# Patient Record
Sex: Female | Born: 1975 | Race: Black or African American | Hispanic: No | Marital: Married
Health system: Southern US, Community
[De-identification: ages and names within clinical notes are randomized; demographics above are authoritative.]

## PROBLEM LIST (undated history)

## (undated) DIAGNOSIS — IMO0002 Reserved for concepts with insufficient information to code with codable children: Secondary | ICD-10-CM

## (undated) DIAGNOSIS — R51 Headache: Secondary | ICD-10-CM

## (undated) HISTORY — PX: ENDOMETRIAL ABLATION: SHX621

## (undated) HISTORY — PX: WISDOM TOOTH EXTRACTION: SHX21

---

## 2000-09-29 ENCOUNTER — Other Ambulatory Visit: Admission: RE | Admit: 2000-09-29 | Discharge: 2000-09-29 | Payer: Self-pay | Admitting: Obstetrics and Gynecology

## 2002-01-19 ENCOUNTER — Other Ambulatory Visit: Admission: RE | Admit: 2002-01-19 | Discharge: 2002-01-19 | Payer: Self-pay | Admitting: Obstetrics and Gynecology

## 2002-03-21 ENCOUNTER — Ambulatory Visit (HOSPITAL_COMMUNITY): Admission: RE | Admit: 2002-03-21 | Discharge: 2002-03-21 | Payer: Self-pay | Admitting: Obstetrics and Gynecology

## 2002-03-21 ENCOUNTER — Encounter: Payer: Self-pay | Admitting: Obstetrics and Gynecology

## 2002-07-18 ENCOUNTER — Inpatient Hospital Stay (HOSPITAL_COMMUNITY): Admission: AD | Admit: 2002-07-18 | Discharge: 2002-07-20 | Payer: Self-pay | Admitting: Obstetrics and Gynecology

## 2003-10-17 ENCOUNTER — Other Ambulatory Visit: Admission: RE | Admit: 2003-10-17 | Discharge: 2003-10-17 | Payer: Self-pay | Admitting: Obstetrics and Gynecology

## 2003-11-12 ENCOUNTER — Ambulatory Visit (HOSPITAL_COMMUNITY): Admission: RE | Admit: 2003-11-12 | Discharge: 2003-11-12 | Payer: Self-pay | Admitting: Obstetrics and Gynecology

## 2004-04-05 ENCOUNTER — Inpatient Hospital Stay (HOSPITAL_COMMUNITY): Admission: AD | Admit: 2004-04-05 | Discharge: 2004-04-07 | Payer: Self-pay | Admitting: Obstetrics and Gynecology

## 2004-11-27 ENCOUNTER — Other Ambulatory Visit: Admission: RE | Admit: 2004-11-27 | Discharge: 2004-11-27 | Payer: Self-pay | Admitting: Obstetrics and Gynecology

## 2006-10-01 ENCOUNTER — Other Ambulatory Visit: Admission: RE | Admit: 2006-10-01 | Discharge: 2006-10-01 | Payer: Self-pay | Admitting: Obstetrics and Gynecology

## 2008-03-27 ENCOUNTER — Other Ambulatory Visit: Admission: RE | Admit: 2008-03-27 | Discharge: 2008-03-27 | Payer: Self-pay | Admitting: Family Medicine

## 2008-04-10 ENCOUNTER — Encounter: Admission: RE | Admit: 2008-04-10 | Discharge: 2008-04-10 | Payer: Self-pay | Admitting: Family Medicine

## 2009-02-28 ENCOUNTER — Ambulatory Visit (HOSPITAL_COMMUNITY): Admission: RE | Admit: 2009-02-28 | Discharge: 2009-02-28 | Payer: Self-pay | Admitting: Obstetrics and Gynecology

## 2009-02-28 ENCOUNTER — Encounter (INDEPENDENT_AMBULATORY_CARE_PROVIDER_SITE_OTHER): Payer: Self-pay | Admitting: Obstetrics and Gynecology

## 2011-02-02 ENCOUNTER — Other Ambulatory Visit (HOSPITAL_COMMUNITY)
Admission: RE | Admit: 2011-02-02 | Discharge: 2011-02-02 | Disposition: A | Discharge status: Home / Self Care | Payer: 59 | Source: Ambulatory Visit | Attending: Family Medicine | Admitting: Family Medicine

## 2011-02-02 ENCOUNTER — Other Ambulatory Visit: Payer: Self-pay | Admitting: Family Medicine

## 2011-02-02 DIAGNOSIS — Z124 Encounter for screening for malignant neoplasm of cervix: Secondary | ICD-10-CM | POA: Insufficient documentation

## 2011-02-03 ENCOUNTER — Ambulatory Visit
Admission: RE | Admit: 2011-02-03 | Discharge: 2011-02-03 | Disposition: A | Discharge status: Home / Self Care | Payer: 59 | Source: Ambulatory Visit | Attending: Family Medicine | Admitting: Family Medicine

## 2011-02-03 MED ORDER — GADOBENATE DIMEGLUMINE 529 MG/ML IV SOLN
20.0000 mL | Freq: Once | INTRAVENOUS | Status: AC | PRN
  Administered 2011-02-03: 20 mL via INTRAVENOUS

## 2011-02-25 LAB — CBC
HCT: 36.3 % (ref 36.0–46.0)
MCHC: 33.6 g/dL (ref 30.0–36.0)
MCV: 77 fL — ABNORMAL LOW (ref 78.0–100.0)
RBC: 4.71 MIL/uL (ref 3.87–5.11)
WBC: 3.2 10*3/uL — ABNORMAL LOW (ref 4.0–10.5)

## 2011-03-31 NOTE — Op Note (Signed)
NAME:  Toni Morales, Toni Morales NO.:  [id number]   MEDICAL RECORD NO.:  [id number]          PATIENT TYPE:  AMB   LOCATION:  SDC                           [facility]ILITY:  [facility]   PHYSICIAN:  Osborn Coho, M.D.   DATE OF BIRTH:  1976/07/21   DATE OF PROCEDURE:  02/28/2009  DATE OF DISCHARGE:                               OPERATIVE REPORT   PREOPERATIVE DIAGNOSES:  1. Menometrorrhagia.  2. Endometrial polyp.   POSTOPERATIVE DIAGNOSES:  1. Menometrorrhagia.  2. Endometrial polyp.   PROCEDURES:  1. Hysteroscopy.  2. Dilation and curettage.  3. Ablation via NovaSure.   ATTENDING:  Osborn Coho, MD   ANESTHESIA:  General.   SPECIMENS TO PATHOLOGY:  Endometrial curettings.   FLUIDS:  700 mL   URINE OUTPUT:  Quantity sufficient via straight cath prior to procedure.   HYSTEROSCOPIC FLUID DEFICIT:  LR 10 mL.   ESTIMATED BLOOD LOSS:  Minimal.   COMPLICATIONS:  None.   FINDINGS:  Uterus sounded 9.5 cm, cervical length measured 3.5 cm,  cavity length measured 6 cm, cavity width measured 4.7 cm, and the  ablation was performed at a power of 155 watts for a total of 1 and 10  seconds.   PROCEDURE IN DETAIL:  The patient was taken to the operating room after  risks, benefits, alternatives were discussed with the patient.  The  patient verbalized understanding, and consent signed and witnessed.  The  patient was placed under general anesthesia and prepped and draped in  normal sterile fashion in the dorsal lithotomy position.  A bivalve  speculum was placed in the patient's vagina and the anterior lip of the  cervix grasped with a single-tooth tenaculum.  A paracervical block was  administered using a total of 10 mL of 1% lidocaine and the uterus  sounded to 9.5 cm.  The hysteroscope was introduced and polypoid lesions  noted on the right lateral uterine wall.  Curettage was performed until  a gritty texture was noted.  The hysteroscope was reintroduced and no  obvious  intracavitary lesions were noted.  The bivalve NovaSure  instrument was then introduced into the uterine cavity and ablation was  performed.  At a power of 155 watts for a total of 1 and 10 seconds.  The ablation instrument remove was removed and the hysteroscope  reintroduced and good ablation results were noted.  All instruments were  removed.  There was small amount of bleeding at the left tenaculum site  and pressure was held and hemostasis was obtained.  Count was correct.  The patient tolerated the procedure well and was currently being  transferred to the recovery room in good condition.       Osborn Coho, M.D.  Electronically Signed     AR/MEDQ  D:  02/28/2009  T:  03/01/2009  Job:  [id number]

## 2011-04-03 NOTE — H&P (Signed)
NAME:  Toni Morales, Toni Morales NO.:  [id number]   MEDICAL RECORD NO.:  [id number]                   PATIENT TYPE:  INP   LOCATION:  9162                                 [facility]ILITY:  [facility]   PHYSICIAN:  Hal Morales, M.D.             DATE OF BIRTH:  [birth year]   DATE OF ADMISSION:  04/05/2004  DATE OF DISCHARGE:                                HISTORY & PHYSICAL   HISTORY OF PRESENT ILLNESS:  Toni Morales is a 35 year old married black  female, gravida 3, para 2-0-0-2, at 38-5/7 weeks, who presents complaining  of uterine contractions every 6-7 minutes now and of increasing intensity.  She does report that her uterine contractions started around 8 a.m. on the  20th.  She denies any leaking but has seen a small amount of bloody show.  She denies any nausea, vomiting, headaches or visual disturbances.  She  reports positive fetal movement.  Her pregnancy has been followed at [facility] by the certified nurse midwife service and has been  essentially uncomplicated but at risk for:  #1 - Closely spaced pregnancies,  #2 - obesity.  Her group B strep is negative.  She would like an epidural  for this labor.   OBSTETRICAL/GYNECOLOGICAL HISTORY:  She is a gravida 3, para 2-0-0-2, who  delivered a viable female infant in August of 1998 who weighed 7 pounds 6  ounces at 40+ weeks' gestation following a 17-hour labor; his name is  Economist.  In September of 2003, she delivered a viable female infant who  weighed 6 pounds 9 ounces at 35 weeks' gestation following an 11-hour labor;  her name is Anaya, delivered by [employer] Williams, C.N.M.  Her LMP for this  pregnancy was July 07, 2003, giving her an [facility] of Apr 13, 2004, confirmed  by early ultrasound.   ALLERGIES:  She has no known drug allergies.   GENERAL MEDICAL HISTORY:  She reports having had the usual childhood  diseases.  She has no medical problems, though has had an occasional urinary  tract  infection.  Her only surgery was oral surgery at age 54 and her only  hospitalization was for childbirth.   FAMILY HISTORY:  Her family history is significant for maternal grandmother,  paternal grandmother and paternal aunt with chronic hypertension and  maternal grandmother with throat cancer.   GENETIC HISTORY:  Genetic history is negative.   SOCIAL HISTORY:  She is married to [facility], who is involved and  supportive.  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  They are of the [place] faith.  They deny  any illicit drug use, alcohol or smoking with this pregnancy.   PRENATAL LABORATORY DATA:  Her blood type is O-positive; her antibody screen  is negative.  Syphilis is nonreactive.  Rubella is immune.  Hepatitis B  surface antigen is negative.  HIV  is nonreactive.  Her GC, Chlamydia and  group B strep at 36 weeks were all negative.   PHYSICAL EXAM:  VITAL SIGNS:  Her vital signs are stable, she is afebrile.  HEENT:  Her HEENT is grossly within normal limits.  HEART:  Her heart is regular rate and rhythm.  CHEST:  Her chest is clear.  BREASTS:  Breasts are soft and nontender.  ABDOMEN:  Her abdomen is gravity with uterine contractions every 5-6  minutes.  Her fetal heart rate is reactive and reassuring.  PELVIC:  Her pelvic exam is 6-7 cm, 90%, vertex -1.  EXTREMITIES:  Her extremities are within normal limits.   ASSESSMENT:  1. Intrauterine pregnancy at term.  2. Active labor.  3. Negative group B streptococcus.  4. Desires epidural for labor.   PLAN:  Her plan is to admit to labor and delivery, to follow routine C.N.M.  orders, to give her an epidural for labor and to notify Dr. Hal Morales of admission.     Concha Pyo. Duplantis, C.N.M.              Hal Morales, M.D.    SJD/MEDQ  D:  04/05/2004  T:  04/05/2004  Job:  [id number]

## 2011-04-03 NOTE — H&P (Signed)
NAME:  Toni Morales, Toni Morales NO.:  [id number]   MEDICAL RECORD NO.:  [id number]                   PATIENT TYPE:  INP   LOCATION:  9168                                 [facility]ACILITY:  [facility]   PHYSICIAN:  Crist Fat. Rivard, M.D.              DATE OF BIRTH:  1975/12/19   DATE OF ADMISSION:  07/18/2002  DATE OF DISCHARGE:                                HISTORY & PHYSICAL   HISTORY OF PRESENT ILLNESS:  The patient is a 35 year old married black  female gravida 2, para 1-0-0-1, at 38-1/7 weeks by LMP and confirmed by  ultrasound who presents complaining of uterine contractions every 3-5  minutes since about 3 o'clock this morning.  She denies any nausea,  vomiting, headaches, or visual disturbances.  She denies any leaking or  vaginal bleeding.  She reports positive fetal movement.  Her pregnancy has  been followed at [facility] by the certified nurse midwife  service and has been essentially uncomplicated though at risk for (1)  obesity, (2) second-trimester bleeding.  She is group B strep negative.  She  plans on an epidural for labor but currently is desiring to ambulate.   OBSTETRICS AND GYNECOLOGIC HISTORY:  She is a gravida 2, para 1-0-0-1, who  delivered a viable female infant in August 1998 who weighed 7 pounds 6 ounces  at 40 weeks after a 17-hour labor.  She delivered vaginally with no  complications and did have an epidural with that labor.  She reports no  other GYN issues.   GENERAL MEDICAL HISTORY:  She has no known drug allergies.  She reports  having had the usual childhood diseases.  She reports occasional urinary  tract infection and a history of oral surgery when she was 35 years old.   FAMILY HISTORY:  Her family history is significant for maternal grandmother  and paternal grandmother with chronic hypertension and maternal grandmother  with throat cancer.   GENETIC HISTORY:  Genetic history is significant only for some distant  relative with sickle cell trait.   SOCIAL HISTORY:  She is married to [place] who is involved and supportive.  They are of the [place] faith.  Both of them are employed full time; she  is in Audiological scientist, he is in Journalist, newspaper.  They deny any illicit drug  use, alcohol or smoking with this pregnancy.   PRENATAL LABORATORIES:  Her blood type is O positive, her antibody screen is  negative.  Sickle cell trait is negative.  Syphilis is nonreactive.  Rubella  is immune.  Hepatitis B surface antigen is negative.  HIV is nonreactive.  GC and Chlamydia are both negative.  Pap smear is within normal limits.  Her  1-hour glucola was 93 and maternal serum alpha-fetoprotein was within normal  range and her 36-week beta strep was negative.   PHYSICAL EXAMINATION:  VITAL SIGNS: Her vital signs are stable.  She is  afebrile.  HEENT: Grossly within normal limits.  HEART: Regular rhythm and rate.  CHEST: Clear.  BREASTS: Soft and nontender.  ABDOMEN: Gravid with uterine contractions every 3-6 minutes.  Fetal heart  rate is reactive and reassuring.  Cervix is 5 cm, 90%, vertex -1 with  bulging membranes.  EXTREMITIES: Within normal limits.   ASSESSMENT:  1. Intrauterine pregnancy at term.  2. Active labor.  3. Group B streptococcal negative.  4. Desires epidural for labor later on.   PLAN:  Her plan is to admit to labor and delivery, to follow routine CNM  orders and to notify Dr. Estanislado Pandy of the patient's admission.     Concha Pyo. Duplantis, C.N.M.              Crist Fat Rivard, M.D.    SJD/MEDQ  D:  07/18/2002  T:  07/18/2002  Job:  [id number]

## 2012-03-22 ENCOUNTER — Other Ambulatory Visit: Payer: Self-pay | Admitting: Neurosurgery

## 2012-05-10 NOTE — Pre-Procedure Instructions (Signed)
20 Toni Morales  05/10/2012   Your procedure is scheduled on:  July 10th  Report to [facility] at 0600 AM.  Call this number if you have problems the morning of surgery: [phone]   Remember:   Do not eat food or drink:After Midnight.  Take these medicines the morning of surgery with A SIP OF WATER: none   Do not wear jewelry, make-up or nail polish.  Do not wear lotions, powders, or perfumes. You may wear deodorant.  Do not shave 48 hours prior to surgery. Men may shave face and neck.  Do not bring valuables to the hospital.  Contacts, dentures or bridgework may not be worn into surgery.  Leave suitcase in the car. After surgery it may be brought to your room.  For patients admitted to the hospital, checkout time is 11:00 AM the day of discharge.   Patients discharged the day of surgery will not be allowed to drive home.  Special Instructions: CHG Shower Use Special Wash: 1/2 bottle night before surgery and 1/2 bottle morning of surgery.   Please read over the following fact sheets that you were given: Pain Booklet, Coughing and Deep Breathing, MRSA Information and Surgical Site Infection Prevention

## 2012-05-11 ENCOUNTER — Inpatient Hospital Stay (HOSPITAL_COMMUNITY): Admission: RE | Admit: 2012-05-11 | Discharge: 2012-05-11 | Payer: 59 | Source: Ambulatory Visit

## 2012-05-11 ENCOUNTER — Encounter (HOSPITAL_COMMUNITY): Payer: Self-pay

## 2012-05-11 HISTORY — DX: Headache: R51

## 2012-05-11 NOTE — Progress Notes (Signed)
PT WOULD ALSO LIKE ADVANCE DIRECTIVE PACKET..(IN WITH CHART).  WE CONFIRMED HER LAB APPT  TIME AND DATE....WILL BE HERE 15 MIN EARLY TO REGISTER. DA

## 2012-05-12 ENCOUNTER — Other Ambulatory Visit (HOSPITAL_COMMUNITY): Payer: 59

## 2012-05-12 ENCOUNTER — Encounter (HOSPITAL_COMMUNITY)
Admission: RE | Admit: 2012-05-12 | Discharge: 2012-05-12 | Disposition: A | Discharge status: Home / Self Care | Payer: 59 | Source: Ambulatory Visit | Attending: Neurosurgery | Admitting: Neurosurgery

## 2012-05-12 ENCOUNTER — Encounter (HOSPITAL_COMMUNITY): Payer: Self-pay | Admitting: Pharmacy Technician

## 2012-05-12 LAB — ABO/RH: ABO/RH(D): O POS

## 2012-05-12 LAB — BASIC METABOLIC PANEL
BUN: 12 mg/dL (ref 6–23)
CO2: 25 mEq/L (ref 19–32)
Calcium: 8.8 mg/dL (ref 8.4–10.5)
Chloride: 105 mEq/L (ref 96–112)
Creatinine, Ser: 0.87 mg/dL (ref 0.50–1.10)

## 2012-05-12 LAB — CBC
HCT: 38.5 % (ref 36.0–46.0)
MCH: 27.1 pg (ref 26.0–34.0)
MCHC: 33.2 g/dL (ref 30.0–36.0)
MCV: 81.4 fL (ref 78.0–100.0)
RDW: 14.7 % (ref 11.5–15.5)

## 2012-05-24 MED ORDER — CEFAZOLIN SODIUM-DEXTROSE 2-3 GM-% IV SOLR
2.0000 g | INTRAVENOUS | Status: AC
  Administered 2012-05-25: 2 g via INTRAVENOUS
  Filled 2012-05-24: qty 50

## 2012-05-25 ENCOUNTER — Encounter (HOSPITAL_COMMUNITY): Payer: Self-pay | Admitting: *Deleted

## 2012-05-25 ENCOUNTER — Inpatient Hospital Stay (HOSPITAL_COMMUNITY)
Admission: RE | Admit: 2012-05-25 | Discharge: 2012-05-29 | DRG: 026 | Disposition: A | Discharge status: Home / Self Care | Payer: 59 | Source: Ambulatory Visit | Attending: Neurosurgery | Admitting: Neurosurgery

## 2012-05-25 ENCOUNTER — Ambulatory Visit (HOSPITAL_COMMUNITY): Payer: 59 | Admitting: *Deleted

## 2012-05-25 ENCOUNTER — Encounter (HOSPITAL_COMMUNITY)
Admission: RE | Disposition: A | Discharge status: Home / Self Care | Payer: Self-pay | Source: Ambulatory Visit | Attending: Neurosurgery

## 2012-05-25 DIAGNOSIS — G935 Compression of brain: Principal | ICD-10-CM | POA: Diagnosis present

## 2012-05-25 DIAGNOSIS — B370 Candidal stomatitis: Secondary | ICD-10-CM | POA: Diagnosis not present

## 2012-05-25 DIAGNOSIS — F17200 Nicotine dependence, unspecified, uncomplicated: Secondary | ICD-10-CM | POA: Diagnosis present

## 2012-05-25 HISTORY — PX: SUBOCCIPITAL CRANIECTOMY CERVICAL LAMINECTOMY: SHX5404

## 2012-05-25 SURGERY — SUBOCCIPITAL CRANIECTOMY CERVICAL LAMINECTOMY/DURAPLASTY
Anesthesia: General | Site: Head | Wound class: Clean

## 2012-05-25 MED ORDER — NITROFURANTOIN MACROCRYSTAL 50 MG PO CAPS
50.0000 mg | ORAL_CAPSULE | Freq: Four times a day (QID) | ORAL | Status: DC
  Administered 2012-05-25 – 2012-05-29 (×16): 50 mg via ORAL
  Filled 2012-05-25 (×18): qty 1

## 2012-05-25 MED ORDER — HYDROMORPHONE HCL PF 1 MG/ML IJ SOLN
INTRAMUSCULAR | Status: AC
  Administered 2012-05-25: 14:00:00
  Filled 2012-05-25: qty 1

## 2012-05-25 MED ORDER — GLYCOPYRROLATE 0.2 MG/ML IJ SOLN
INTRAMUSCULAR | Status: DC | PRN
  Administered 2012-05-25: .7 mg via INTRAVENOUS

## 2012-05-25 MED ORDER — PROPOFOL 10 MG/ML IV EMUL
INTRAVENOUS | Status: DC | PRN
  Administered 2012-05-25: 200 mg via INTRAVENOUS
  Administered 2012-05-25: 100 mg via INTRAVENOUS

## 2012-05-25 MED ORDER — FENTANYL CITRATE 0.05 MG/ML IJ SOLN
INTRAMUSCULAR | Status: DC | PRN
  Administered 2012-05-25 (×2): 100 ug via INTRAVENOUS
  Administered 2012-05-25 (×4): 50 ug via INTRAVENOUS
  Administered 2012-05-25 (×2): 100 ug via INTRAVENOUS

## 2012-05-25 MED ORDER — DEXAMETHASONE SODIUM PHOSPHATE 4 MG/ML IJ SOLN
INTRAMUSCULAR | Status: DC | PRN
  Administered 2012-05-25: 10 mg via INTRAVENOUS

## 2012-05-25 MED ORDER — LACTATED RINGERS IV SOLN
INTRAVENOUS | Status: DC | PRN
  Administered 2012-05-25: 10:00:00 via INTRAVENOUS

## 2012-05-25 MED ORDER — POTASSIUM CHLORIDE IN NACL 20-0.9 MEQ/L-% IV SOLN
INTRAVENOUS | Status: DC
  Administered 2012-05-25 – 2012-05-26 (×2): via INTRAVENOUS
  Filled 2012-05-25 (×7): qty 1000

## 2012-05-25 MED ORDER — MORPHINE SULFATE 2 MG/ML IJ SOLN
INTRAMUSCULAR | Status: AC
  Administered 2012-05-25: 2 mg via INTRAVENOUS
  Filled 2012-05-25: qty 1

## 2012-05-25 MED ORDER — MIDAZOLAM HCL 5 MG/5ML IJ SOLN
INTRAMUSCULAR | Status: DC | PRN
  Administered 2012-05-25: 2 mg via INTRAVENOUS

## 2012-05-25 MED ORDER — ONDANSETRON HCL 4 MG PO TABS: 4.0000 mg | ORAL_TABLET | ORAL | Status: DC | PRN

## 2012-05-25 MED ORDER — DEXAMETHASONE SODIUM PHOSPHATE 4 MG/ML IJ SOLN
4.0000 mg | Freq: Three times a day (TID) | INTRAMUSCULAR | Status: DC
  Administered 2012-05-27 – 2012-05-28 (×2): 4 mg via INTRAVENOUS
  Filled 2012-05-25 (×5): qty 1

## 2012-05-25 MED ORDER — ONDANSETRON HCL 4 MG/2ML IJ SOLN: 4.0000 mg | Freq: Four times a day (QID) | INTRAMUSCULAR | Status: DC | PRN

## 2012-05-25 MED ORDER — SODIUM CHLORIDE 0.9 % IV SOLN
INTRAVENOUS | Status: DC | PRN
  Administered 2012-05-25: 08:00:00 via INTRAVENOUS

## 2012-05-25 MED ORDER — HYDROCODONE-ACETAMINOPHEN 5-325 MG PO TABS
1.0000 | ORAL_TABLET | ORAL | Status: DC | PRN
  Administered 2012-05-25 – 2012-05-26 (×6): 1 via ORAL
  Filled 2012-05-25 (×6): qty 1

## 2012-05-25 MED ORDER — DEXAMETHASONE SODIUM PHOSPHATE 4 MG/ML IJ SOLN
4.0000 mg | Freq: Four times a day (QID) | INTRAMUSCULAR | Status: AC
  Administered 2012-05-26 – 2012-05-27 (×4): 4 mg via INTRAVENOUS
  Filled 2012-05-25 (×4): qty 1

## 2012-05-25 MED ORDER — LIDOCAINE-EPINEPHRINE 0.5 %-1:200000 IJ SOLN
INTRAMUSCULAR | Status: DC | PRN
  Administered 2012-05-25: 50 mL

## 2012-05-25 MED ORDER — DEXAMETHASONE SODIUM PHOSPHATE 10 MG/ML IJ SOLN
6.0000 mg | Freq: Four times a day (QID) | INTRAMUSCULAR | Status: AC
  Administered 2012-05-25 – 2012-05-26 (×4): 6 mg via INTRAVENOUS
  Filled 2012-05-25 (×5): qty 0.6
  Filled 2012-05-25: qty 1

## 2012-05-25 MED ORDER — PANTOPRAZOLE SODIUM 40 MG IV SOLR
40.0000 mg | Freq: Every day | INTRAVENOUS | Status: DC
  Administered 2012-05-25 – 2012-05-26 (×2): 40 mg via INTRAVENOUS
  Filled 2012-05-25 (×3): qty 40

## 2012-05-25 MED ORDER — LABETALOL HCL 5 MG/ML IV SOLN: 10.0000 mg | INTRAVENOUS | Status: DC | PRN

## 2012-05-25 MED ORDER — LIDOCAINE HCL (CARDIAC) 20 MG/ML IV SOLN
INTRAVENOUS | Status: DC | PRN
  Administered 2012-05-25: 100 mg via INTRAVENOUS

## 2012-05-25 MED ORDER — HEMOSTATIC AGENTS (NO CHARGE) OPTIME
TOPICAL | Status: DC | PRN
  Administered 2012-05-25: 1 via TOPICAL

## 2012-05-25 MED ORDER — ONDANSETRON HCL 4 MG/2ML IJ SOLN
INTRAMUSCULAR | Status: DC | PRN
  Administered 2012-05-25: 4 mg via INTRAVENOUS

## 2012-05-25 MED ORDER — ROCURONIUM BROMIDE 100 MG/10ML IV SOLN
INTRAVENOUS | Status: DC | PRN
  Administered 2012-05-25 (×2): 5 mg via INTRAVENOUS
  Administered 2012-05-25: 7.5 mg via INTRAVENOUS
  Administered 2012-05-25 (×2): 5 mg via INTRAVENOUS
  Administered 2012-05-25: 10 mg via INTRAVENOUS
  Administered 2012-05-25: 50 mg via INTRAVENOUS

## 2012-05-25 MED ORDER — PROMETHAZINE HCL 25 MG PO TABS: 12.5000 mg | ORAL_TABLET | ORAL | Status: DC | PRN

## 2012-05-25 MED ORDER — PHENYLEPHRINE HCL 10 MG/ML IJ SOLN
10.0000 mg | INTRAVENOUS | Status: DC | PRN
  Administered 2012-05-25: 20 ug/min via INTRAVENOUS

## 2012-05-25 MED ORDER — THROMBIN 20000 UNITS EX KIT
PACK | CUTANEOUS | Status: DC | PRN
  Administered 2012-05-25: 20000 [IU] via TOPICAL

## 2012-05-25 MED ORDER — 0.9 % SODIUM CHLORIDE (POUR BTL) OPTIME
TOPICAL | Status: DC | PRN
  Administered 2012-05-25 (×5): 1000 mL

## 2012-05-25 MED ORDER — NEOSTIGMINE METHYLSULFATE 1 MG/ML IJ SOLN
INTRAMUSCULAR | Status: DC | PRN
  Administered 2012-05-25: 4 mg via INTRAVENOUS

## 2012-05-25 MED ORDER — MORPHINE SULFATE 2 MG/ML IJ SOLN
1.0000 mg | INTRAMUSCULAR | Status: DC | PRN
  Administered 2012-05-25 – 2012-05-27 (×12): 2 mg via INTRAVENOUS
  Filled 2012-05-25: qty 1
  Filled 2012-05-25: qty 2
  Filled 2012-05-25 (×10): qty 1

## 2012-05-25 MED ORDER — ONDANSETRON HCL 4 MG/2ML IJ SOLN
4.0000 mg | INTRAMUSCULAR | Status: DC | PRN
  Administered 2012-05-25: 4 mg via INTRAVENOUS
  Filled 2012-05-25: qty 2

## 2012-05-25 MED ORDER — HYDROMORPHONE HCL PF 1 MG/ML IJ SOLN
0.2500 mg | INTRAMUSCULAR | Status: DC | PRN
  Administered 2012-05-25: 0.5 mg via INTRAVENOUS

## 2012-05-25 SURGICAL SUPPLY — 84 items
APL SKNCLS STERI-STRIP NONHPOA (GAUZE/BANDAGES/DRESSINGS)
BENZOIN TINCTURE PRP APPL 2/3 (GAUZE/BANDAGES/DRESSINGS) IMPLANT
BLADE SURG 15 STRL LF DISP TIS (BLADE) IMPLANT
BLADE SURG 15 STRL SS (BLADE) ×2
BLADE SURG ROTATE 9660 (MISCELLANEOUS) ×4 IMPLANT
BLADE ULTRA TIP 2M (BLADE) IMPLANT
BRUSH SCRUB EZ 1% IODOPHOR (MISCELLANEOUS) ×2 IMPLANT
BUR ACORN 6.0 PRECISION (BURR) ×2 IMPLANT
CANISTER SUCTION 2500CC (MISCELLANEOUS) ×2 IMPLANT
CLIP TI MEDIUM 6 (CLIP) ×2 IMPLANT
CLOTH BEACON ORANGE TIMEOUT ST (SAFETY) ×2 IMPLANT
CONT SPEC 4OZ CLIKSEAL STRL BL (MISCELLANEOUS) ×2 IMPLANT
CORDS BIPOLAR (ELECTRODE) ×2 IMPLANT
COVER TABLE BACK 60X90 (DRAPES) IMPLANT
DECANTER SPIKE VIAL GLASS SM (MISCELLANEOUS) ×2 IMPLANT
DRAIN SNY WOU 7FLT (WOUND CARE) IMPLANT
DRAPE LAPAROTOMY 100X72 PEDS (DRAPES) ×2 IMPLANT
DRAPE MICROSCOPE LEICA (MISCELLANEOUS) IMPLANT
DRAPE WARM FLUID 44X44 (DRAPE) ×2 IMPLANT
DRESSING TELFA 8X3 (GAUZE/BANDAGES/DRESSINGS) ×2 IMPLANT
DURAGUARD 06CMX08CM ×1 IMPLANT
DURAPREP 6ML APPLICATOR 50/CS (WOUND CARE) ×2 IMPLANT
DURASEAL SPINE SEALANT 3ML (MISCELLANEOUS) ×1 IMPLANT
ELECT CAUTERY BLADE 6.4 (BLADE) ×2 IMPLANT
ELECT REM PT RETURN 9FT ADLT (ELECTROSURGICAL) ×2
ELECTRODE REM PT RTRN 9FT ADLT (ELECTROSURGICAL) ×1 IMPLANT
EVACUATOR 1/8 PVC DRAIN (DRAIN) IMPLANT
EVACUATOR SILICONE 100CC (DRAIN) IMPLANT
EXTENDED TIP APPLICATOR ×1 IMPLANT
GAUZE SPONGE 4X4 16PLY XRAY LF (GAUZE/BANDAGES/DRESSINGS) IMPLANT
GLOVE BIO SURGEON STRL SZ 6.5 (GLOVE) IMPLANT
GLOVE BIO SURGEON STRL SZ7 (GLOVE) IMPLANT
GLOVE BIO SURGEON STRL SZ7.5 (GLOVE) IMPLANT
GLOVE BIO SURGEON STRL SZ8 (GLOVE) ×1 IMPLANT
GLOVE BIO SURGEON STRL SZ8.5 (GLOVE) IMPLANT
GLOVE BIOGEL M 8.0 STRL (GLOVE) IMPLANT
GLOVE ECLIPSE 6.5 STRL STRAW (GLOVE) ×2 IMPLANT
GLOVE ECLIPSE 7.0 STRL STRAW (GLOVE) IMPLANT
GLOVE ECLIPSE 7.5 STRL STRAW (GLOVE) IMPLANT
GLOVE ECLIPSE 8.0 STRL XLNG CF (GLOVE) IMPLANT
GLOVE ECLIPSE 8.5 STRL (GLOVE) IMPLANT
GLOVE EXAM NITRILE LRG STRL (GLOVE) IMPLANT
GLOVE EXAM NITRILE MD LF STRL (GLOVE) ×1 IMPLANT
GLOVE EXAM NITRILE XL STR (GLOVE) IMPLANT
GLOVE EXAM NITRILE XS STR PU (GLOVE) IMPLANT
GLOVE INDICATOR 6.5 STRL GRN (GLOVE) IMPLANT
GLOVE INDICATOR 7.0 STRL GRN (GLOVE) ×2 IMPLANT
GLOVE INDICATOR 7.5 STRL GRN (GLOVE) IMPLANT
GLOVE INDICATOR 8.0 STRL GRN (GLOVE) IMPLANT
GLOVE INDICATOR 8.5 STRL (GLOVE) IMPLANT
GLOVE OPTIFIT SS 8.0 STRL (GLOVE) IMPLANT
GLOVE SKINSENSE NS SZ7.0 (GLOVE) ×4
GLOVE SKINSENSE STRL SZ7.0 (GLOVE) IMPLANT
GLOVE SURG SS PI 6.5 STRL IVOR (GLOVE) IMPLANT
GOWN BRE IMP SLV AUR LG STRL (GOWN DISPOSABLE) ×2 IMPLANT
GOWN BRE IMP SLV AUR XL STRL (GOWN DISPOSABLE) ×3 IMPLANT
GOWN STRL REIN 2XL LVL4 (GOWN DISPOSABLE) IMPLANT
HEMOSTAT SURGICEL 2X14 (HEMOSTASIS) IMPLANT
KIT BASIN OR (CUSTOM PROCEDURE TRAY) ×2 IMPLANT
KIT ROOM TURNOVER OR (KITS) ×2 IMPLANT
NDL HYPO 25X1 1.5 SAFETY (NEEDLE) ×1 IMPLANT
NEEDLE HYPO 25X1 1.5 SAFETY (NEEDLE) ×4 IMPLANT
NS IRRIG 1000ML POUR BTL (IV SOLUTION) ×2 IMPLANT
PACK CRANIOTOMY (CUSTOM PROCEDURE TRAY) ×2 IMPLANT
PAD ARMBOARD 7.5X6 YLW CONV (MISCELLANEOUS) ×6 IMPLANT
PATTIES SURGICAL 1/4 X 3 (GAUZE/BANDAGES/DRESSINGS) IMPLANT
RUBBERBAND STERILE (MISCELLANEOUS) IMPLANT
SPONGE GAUZE 4X4 12PLY (GAUZE/BANDAGES/DRESSINGS) IMPLANT
SPONGE LAP 4X18 X RAY DECT (DISPOSABLE) IMPLANT
SPONGE SURGIFOAM ABS GEL 100 (HEMOSTASIS) ×2 IMPLANT
STAPLER SKIN PROX WIDE 3.9 (STAPLE) IMPLANT
SUT ETHILON 3 0 FSL (SUTURE) ×1 IMPLANT
SUT NURALON 4 0 TR CR/8 (SUTURE) ×4 IMPLANT
SUT VIC AB 0 CT1 18XCR BRD8 (SUTURE) ×1 IMPLANT
SUT VIC AB 0 CT1 8-18 (SUTURE) ×2
SUT VIC AB 2-0 CT2 18 VCP726D (SUTURE) ×2 IMPLANT
SUT VIC AB 3-0 SH 8-18 (SUTURE) IMPLANT
SYR 20ML ECCENTRIC (SYRINGE) ×2 IMPLANT
SYR CONTROL 10ML LL (SYRINGE) ×2 IMPLANT
TOWEL OR 17X24 6PK STRL BLUE (TOWEL DISPOSABLE) IMPLANT
TOWEL OR 17X26 10 PK STRL BLUE (TOWEL DISPOSABLE) ×2 IMPLANT
TRAY FOLEY CATH 14FRSI W/METER (CATHETERS) ×1 IMPLANT
UNDERPAD 30X30 INCONTINENT (UNDERPADS AND DIAPERS) IMPLANT
WATER STERILE IRR 1000ML POUR (IV SOLUTION) ×2 IMPLANT

## 2012-05-25 NOTE — Progress Notes (Signed)
Patient ID: Toni Morales, female   DOB: 1976/03/15, 36 y.o.   MRN: [id number] BP 108/68  Pulse 108  Temp 98.5 F (36.9 C) (Oral)  Resp 15  Ht 5\' 4"  (1.626 m)  Wt 101 kg (222 lb 10.6 oz)  BMI 38.22 kg/m2  SpO2 96% Alert and following commands. Peerl, full eom Symmetric facial sensation, tongue and uvula midline Moving all extremities well.  Dressing stained and dry.  Doing well post op

## 2012-05-25 NOTE — Op Note (Signed)
05/25/2012  12:49 PM  PATIENT:  Toni Morales  36 y.o. femalewith a chiari malformation. Admitted today for a suboccipital and  c1 laminectomy.  PRE-OPERATIVE DIAGNOSIS:  chiari malformation  POST-OPERATIVE DIAGNOSIS:  chiari malformation  PROCEDURE:  Procedure(s): SUBOCCIPITAL CRANIECTOMY CERVICAL LAMINECTOMY C1/DURAPLASTY  SURGEON:  Surgeon(s): Carmela Hurt, MD Tia Alert, MD  ASSISTANTS:David Yetta Barre  ANESTHESIA:   general  EBL:  Total I/O In: 1800 [I.V.:1800] Out: 500 [Urine:400; Blood:100]  BLOOD ADMINISTERED:none  CELL SAVER GIVEN:none  COUNT:per nursing   DRAINS: none   SPECIMEN:  No Specimen  DICTATION: Toni Morales was brought to the operating room intubated and placed under a general anesthetic. A foley catheter was placed under sterile conditions. I placed a 3 pin Mayfield headholder after adequate anesthesia was obtained to ~65lbs of pressure. She was then rolled prone onto the operating room table with body rolls in position. All pressure points were properly padded. Her head was then shaved and prepped in a sterile fashion. I infiltrated 10 cc lidocaine into the suboccipital region and upper cervical area. I opened the incision with a 10 blade and took the incision down to the deep cervical fascia. Working in the midline avascular plane I exposed the occiput, posterior arch of C1 and C2.  I used a drill to thin the bone in the suboccipital region until I could use a Kerrison punch to remove the bone exposing the posterior fossa dura. I removed the posterior aspect of C1 with the drill , Leksell rongeur, and Kerrison punches. I opened the dura in a Y shape with the base at C2. I was able to see below the tonsils and had robust csf flow. I cut a piece of bovine pericardium to fit my opening then started my closure. I closed the graft with neurolon sutures in running fashion. I had a valsalva performed and did not see spinal fluid. The Graft was also  pulsatile. I then used duraseal to augment my closure.  Dr. Yetta Barre and I then closed the wound in layers using vicryl sutures subcutaneously and a nylon running vertical mattress suture to approximate the skin edges.  I applied a sterile dressing, then rolled her supine. I then removed the [facility] without difficulty.  She was extubated without difficulty.   PLAN OF CARE: Admit to inpatient   PATIENT DISPOSITION:  PACU - hemodynamically stable.   Delay start of Pharmacological VTE agent (>24hrs) due to surgical blood loss or risk of bleeding:  yes

## 2012-05-25 NOTE — Anesthesia Postprocedure Evaluation (Signed)
Anesthesia Post Note  Patient: Toni Morales  Procedure(s) Performed: Procedure(s) (LRB): SUBOCCIPITAL CRANIECTOMY CERVICAL LAMINECTOMY/DURAPLASTY (N/A)  Anesthesia type: General  Patient location: PACU  Post pain: Pain level controlled and Adequate analgesia  Post assessment: Post-op Vital signs reviewed, Patient's Cardiovascular Status Stable, Respiratory Function Stable, Patent Airway and Pain level controlled  Last Vitals:  Filed Vitals:   05/25/12 1245  BP:   Pulse:   Temp:   Resp: 18    Post vital signs: Reviewed and stable  Level of consciousness: awake, alert  and oriented  Complications: No apparent anesthesia complications

## 2012-05-25 NOTE — H&P (Signed)
BP 108/73  Pulse 58  Temp 98.2 F (36.8 C) (Oral)  Resp 18  SpO2 99% Toni Morales presents with a Chiari malformation and headaches. Mri confirms the diagnosis.  Past Medical History  Diagnosis Date  . Headache    Past Surgical History  Procedure Date  . Wisdom tooth extraction   . Endometrial ablation    No Known Allergies Prior to Admission medications   Medication Sig Start Date End Date Taking? Authorizing Provider  naproxen sodium (ANAPROX) 220 MG tablet Take 220-440 mg by mouth 2 (two) times daily as needed. Headache pain   Yes Historical Provider, MD  nitrofurantoin (MACRODANTIN) 50 MG capsule Take 50 mg by mouth 4 (four) times daily. Completed 05/07/2012, for UTI   Yes Historical Provider, MD  OVER THE COUNTER MEDICATION Take 1 tablet by mouth 2 (two) times daily as needed. Mucas relief   Yes Historical Provider, MD   History   Social History  . Marital Status: Married    Spouse Name: N/A    Number of Children: N/A  . Years of Education: N/A   Occupational History  . Not on file.   Social History Main Topics  . Smoking status: Passive Smoker -- 0.2 packs/day for 5 years    Types: Cigarettes  . Smokeless tobacco: Not on file  . Alcohol Use: No  . Drug Use: No  . Sexually Active:    Other Topics Concern  . Not on file   Social History Narrative  . No narrative on file   Alert and oriented x 4\ Speech clear and fluent 5/5 strength Perrl, full eom Symmetric facies, tongue and uvula midline Lungs clear Heart rrr, no murmurs or rubs AP Or for chiari decompression Risks and benefits explained  Brain damage, stroke coma death, weakness, csf leak, no relief of symptoms She agrees to proceed.

## 2012-05-25 NOTE — Transfer of Care (Signed)
Immediate Anesthesia Transfer of Care Note  Patient: Toni Morales  Procedure(s) Performed: Procedure(s) (LRB): SUBOCCIPITAL CRANIECTOMY CERVICAL LAMINECTOMY/DURAPLASTY (N/A)  Patient Location: PACU  Anesthesia Type: General  Level of Consciousness: sedated  Airway & Oxygen Therapy: Patient Spontanous Breathing and Patient connected to nasal cannula oxygen  Post-op Assessment: Report given to PACU RN and Post -op Vital signs reviewed and stable  Post vital signs: Reviewed  Complications: No apparent anesthesia complications

## 2012-05-25 NOTE — Anesthesia Preprocedure Evaluation (Signed)
Anesthesia Evaluation  Patient identified by MRN, date of birth, ID band Patient awake    Reviewed: Allergy & Precautions, H&P , NPO status , Patient's Chart, lab work & pertinent test results  Airway Mallampati: II  Neck ROM: full    Dental   Pulmonary          Cardiovascular     Neuro/Psych  Headaches,    GI/Hepatic   Endo/Other  obese  Renal/GU      Musculoskeletal   Abdominal   Peds  Hematology   Anesthesia Other Findings   Reproductive/Obstetrics                           Anesthesia Physical Anesthesia Plan  ASA: II  Anesthesia Plan: General   Post-op Pain Management:    Induction: Intravenous  Airway Management Planned: Oral ETT  Additional Equipment: Arterial line  Intra-op Plan:   Post-operative Plan: Extubation in OR  Informed Consent: I have reviewed the patients History and Physical, chart, labs and discussed the procedure including the risks, benefits and alternatives for the proposed anesthesia with the patient or authorized representative who has indicated his/her understanding and acceptance.     Plan Discussed with: CRNA and Surgeon  Anesthesia Plan Comments:         Anesthesia Quick Evaluation

## 2012-05-25 NOTE — Addendum Note (Signed)
Addendum  created 05/25/12 1412 by Sande Brothers, CRNA   Modules edited:Anesthesia LDA

## 2012-05-25 NOTE — Addendum Note (Signed)
Addendum  created 05/25/12 1412 by Abra Lingenfelter Marie Glenn Gullickson, CRNA   Modules edited:Anesthesia LDA    

## 2012-05-25 NOTE — Preoperative (Signed)
Beta Blockers   Reason not to administer Beta Blockers:Not Applicable 

## 2012-05-26 ENCOUNTER — Encounter (HOSPITAL_COMMUNITY): Payer: Self-pay | Admitting: Neurosurgery

## 2012-05-26 MED ORDER — HYDROCODONE-ACETAMINOPHEN 5-325 MG PO TABS
1.0000 | ORAL_TABLET | ORAL | Status: DC | PRN
  Administered 2012-05-26 – 2012-05-29 (×15): 2 via ORAL
  Filled 2012-05-26 (×15): qty 2

## 2012-05-26 NOTE — Progress Notes (Signed)
Report given to Tobi Bastos, RN on 4N. Pt stable VSS denies pain. Able to transport in wheelchair. Belongings with husband. Transported to [facility] with assisted by tech.

## 2012-05-26 NOTE — Progress Notes (Signed)
Patient ID: Toni Morales, female   DOB: September 13, 1976, 36 y.o.   MRN: [id number] BP 115/69  Pulse 90  Temp 98.7 F (37.1 C) (Oral)  Resp 19  Ht 5\' 4"  (1.626 m)  Wt 101 kg (222 lb 10.6 oz)  BMI 38.22 kg/m2  SpO2 97% Alert and oriented x 4. Speech clear and fluent. Perrl, Full Eom Symmetric facies, tongue and uvula  Moving all extremities  Dressing intact. Will transfer to floor.

## 2012-05-27 MED ORDER — BISACODYL 5 MG PO TBEC: 10.0000 mg | DELAYED_RELEASE_TABLET | Freq: Every day | ORAL | Status: DC | PRN

## 2012-05-27 MED ORDER — GUAIFENESIN ER 600 MG PO TB12
600.0000 mg | ORAL_TABLET | Freq: Two times a day (BID) | ORAL | Status: DC
  Administered 2012-05-27 – 2012-05-29 (×4): 600 mg via ORAL
  Filled 2012-05-27 (×5): qty 1

## 2012-05-27 MED ORDER — PANTOPRAZOLE SODIUM 40 MG PO TBEC
40.0000 mg | DELAYED_RELEASE_TABLET | Freq: Every day | ORAL | Status: DC
  Administered 2012-05-27 – 2012-05-28 (×2): 40 mg via ORAL
  Filled 2012-05-27: qty 1

## 2012-05-27 MED ORDER — HYDROCODONE-ACETAMINOPHEN 5-325 MG PO TABS: 1.0000 | ORAL_TABLET | Freq: Four times a day (QID) | ORAL | Status: AC | PRN

## 2012-05-27 NOTE — Progress Notes (Signed)
Patient ID: Toni Morales, female   DOB: 06-Jan-1976, 36 y.o.   MRN: [id number] BP 106/58  Pulse 74  Temp 98.4 F (36.9 C) (Oral)  Resp 18  Ht 5\' 4"  (1.626 m)  Wt 99.5 kg (219 lb 5.7 oz)  BMI 37.65 kg/m2  SpO2 98% Alert and oriented x 4 Speech clear and fluent Moving all extremities well Wound clean and dry.  Possible dc on sunday

## 2012-05-28 MED ORDER — DEXAMETHASONE 4 MG PO TABS
4.0000 mg | ORAL_TABLET | Freq: Two times a day (BID) | ORAL | Status: DC
  Administered 2012-05-28 – 2012-05-29 (×3): 4 mg via ORAL
  Filled 2012-05-28 (×4): qty 1

## 2012-05-28 MED ORDER — MAGIC MOUTHWASH
10.0000 mL | Freq: Four times a day (QID) | ORAL | Status: DC
  Administered 2012-05-28 – 2012-05-29 (×4): 10 mL via ORAL
  Filled 2012-05-28 (×8): qty 10

## 2012-05-28 NOTE — Progress Notes (Signed)
Subjective: Patient reports Patient is doing well neck pain getting better as  Objective: Vital signs in last 24 hours: Temp:  [98 F (36.7 C)-99.2 F (37.3 C)] 98.5 F (36.9 C) (07/13 0921) Pulse Rate:  [62-98] 70  (07/13 0921) Resp:  [17-20] 20  (07/13 0921) BP: (92-119)/(51-81) 119/81 mmHg (07/13 0921) SpO2:  [96 %-100 %] 100 % (07/13 0921)  Intake/Output from previous day: 07/12 0701 - 07/13 0700 In: 720 [P.O.:720] Out: -  Intake/Output this shift:    strength 5 out of 5 incision is clean and dry  Lab Results: No results found for this basename: WBC:2,HGB:2,HCT:2,PLT:2 in the last 72 hours BMET No results found for this basename: NA:2,K:2,CL:2,CO2:2,GLUCOSE:2,BUN:2,CREATININE:2,CALCIUM:2 in the last 72 hours  Studies/Results: No results found.  Assessment/Plan: Progressive mobilization today work on pain management offer morphine on oral analgesics patient appears to have thrush will start Magic mouthwash  LOS: 3 days     Andrya Roppolo P 05/28/2012, 9:27 AM

## 2012-05-29 MED ORDER — DEXAMETHASONE 1 MG PO TABS: ORAL_TABLET | ORAL | Status: DC

## 2012-05-29 NOTE — Discharge Summary (Signed)
Physician Discharge Summary  Patient ID: Toni Morales MRN: [id number] DOB/AGE: Oct 29, 1976 36 y.o.  Admit date: 05/25/2012 Discharge date: 05/29/2012  Admission Diagnoses: Chiari malformation type I  Discharge Diagnoses: Chiari malformation type I Principal Problem:  *Chiari malformation type I   Discharged Condition: fair  Hospital Course: Patient was admitted to undergo surgical decompression of a Chiari malformation. She tolerated the surgery well. Her incision is clean and dry. She has had some headaches during the postoperative period and has been placed on a Decadron taper.  Consults: None  Significant Diagnostic Studies: None  Treatments: Suboccipital decompression for Chiari malformation.  Discharge Exam: Blood pressure 107/69, pulse 67, temperature 97.8 F (36.6 C), temperature source Oral, resp. rate 18, height 5\' 4"  (1.626 m), weight 99.5 kg (219 lb 5.7 oz), SpO2 99.00%. Motor function is normal station and gait are normal incision is clean and dry  Disposition:  discharge home  Discharge Orders    Future Orders Please Complete By Expires   Diet - low sodium heart healthy      Increase activity slowly      Call MD for:  redness, tenderness, or signs of infection (pain, swelling, redness, odor or green/yellow discharge around incision site)      Call MD for:  severe uncontrolled pain      Call MD for:  temperature >100.4        Medication List  As of 05/29/2012 11:57 AM   TAKE these medications         dexamethasone 1 MG tablet   Commonly known as: DECADRON   2 tablets twice daily for 2 days, one tablet twice daily for 2 days, one tablet daily for 2 days.      HYDROcodone-acetaminophen 5-325 MG per tablet   Commonly known as: NORCO   Take 1-2 tablets by mouth every 6 (six) hours as needed for pain.      naproxen sodium 220 MG tablet   Commonly known as: ANAPROX   Take 220-440 mg by mouth 2 (two) times daily as needed. Headache pain      nitrofurantoin  50 MG capsule   Commonly known as: MACRODANTIN   Take 50 mg by mouth 4 (four) times daily. Completed 05/07/2012, for UTI      OVER THE COUNTER MEDICATION   Take 1 tablet by mouth 2 (two) times daily as needed. Mucas relief           Follow-up Information    Follow up with CABBELL,KYLE L, MD in 7 days. (for suture removal)    Contact information:   [address] [place] [address] [phone]          Signed: Stefani Dama 05/29/2012, 11:57 AM

## 2013-08-17 ENCOUNTER — Emergency Department (HOSPITAL_COMMUNITY): Payer: 59

## 2013-08-17 ENCOUNTER — Emergency Department (HOSPITAL_COMMUNITY)
Admission: EM | Admit: 2013-08-17 | Discharge: 2013-08-17 | Disposition: A | Discharge status: Home / Self Care | Payer: 59 | Attending: Emergency Medicine | Admitting: Emergency Medicine

## 2013-08-17 ENCOUNTER — Encounter (HOSPITAL_COMMUNITY): Payer: Self-pay | Admitting: Emergency Medicine

## 2013-08-17 DIAGNOSIS — M7912 Myalgia of auxiliary muscles, head and neck: Secondary | ICD-10-CM

## 2013-08-17 DIAGNOSIS — Z79899 Other long term (current) drug therapy: Secondary | ICD-10-CM | POA: Insufficient documentation

## 2013-08-17 DIAGNOSIS — Z8669 Personal history of other diseases of the nervous system and sense organs: Secondary | ICD-10-CM | POA: Insufficient documentation

## 2013-08-17 DIAGNOSIS — M62838 Other muscle spasm: Secondary | ICD-10-CM | POA: Insufficient documentation

## 2013-08-17 HISTORY — DX: Reserved for concepts with insufficient information to code with codable children: IMO0002

## 2013-08-17 MED ORDER — OXYCODONE-ACETAMINOPHEN 5-325 MG PO TABS: 1.0000 | ORAL_TABLET | Freq: Four times a day (QID) | ORAL | Status: AC | PRN

## 2013-08-17 MED ORDER — DIAZEPAM 10 MG PO TABS: 10.0000 mg | ORAL_TABLET | Freq: Three times a day (TID) | ORAL | Status: AC | PRN

## 2013-08-17 MED ORDER — ONDANSETRON 4 MG PO TBDP
4.0000 mg | ORAL_TABLET | Freq: Once | ORAL | Status: AC
  Administered 2013-08-17: 4 mg via ORAL
  Filled 2013-08-17: qty 1

## 2013-08-17 MED ORDER — DIAZEPAM 5 MG PO TABS
10.0000 mg | ORAL_TABLET | Freq: Once | ORAL | Status: AC
  Administered 2013-08-17: 10 mg via ORAL
  Filled 2013-08-17: qty 2

## 2013-08-17 MED ORDER — HYDROMORPHONE HCL PF 1 MG/ML IJ SOLN
1.0000 mg | Freq: Once | INTRAMUSCULAR | Status: AC
  Administered 2013-08-17: 1 mg via INTRAMUSCULAR
  Filled 2013-08-17: qty 1

## 2013-08-17 NOTE — ED Notes (Signed)
Pt presents to ED for evaluation of right sided neck pain since last Tuesday that has been severely worse today.  Pt has a hx of Chiari malformation- had surgery to correct this last July.  Pt developed neck spasms in May of this year- went to PCP and was told to take antiinflammatories for 2 weeks- pt did so and had relief of the pain.  On Tuesday of last week the pain returned to the right side of pt neck, today at work pt was reaching up and developed sharp pains to the right side of her neck.  Able to move all extremities well.  Denies any changes in vision, urination, or bowels.  Alert and oriented x 4 at present.

## 2013-08-17 NOTE — ED Provider Notes (Addendum)
CSN: [id number]     Arrival date & time 08/17/13  0025 History   First MD Initiated Contact with Patient 08/17/13 0222     Chief Complaint  Patient presents with  . Neck Pain   (Consider location/radiation/quality/duration/timing/severity/associated sxs/prior Treatment) Patient is a 37 y.o. female presenting with neck pain. The history is provided by the patient.  Neck Pain Pain location:  R side Quality:  Cramping, stabbing, stiffness and shooting Stiffness is present:  All day Pain radiates to:  R shoulder (right head) Pain severity:  Severe Pain is:  Same all the time Onset quality:  Gradual Duration:  2 days Timing:  Constant Progression:  Worsening Chronicity:  Recurrent Context: not fall, not jumping from heights, not lifting a heavy object, not MCA and not recent injury   Context comment:  States that she had brain surgery last year and has had similar pain to this before but worse today and not improved with aleve Relieved by:  Nothing Worsened by:  Position, twisting and bending (moving head) Ineffective treatments:  NSAIDs Associated symptoms: no bladder incontinence, no bowel incontinence, no fever, no headaches, no numbness, no photophobia, no tingling, no visual change and no weakness   Risk factors: no hx of head and neck radiation, no hx of spinal trauma and no recent head injury     Past Medical History  Diagnosis Date  . Headache(784.0)   . Chiari malformation    Past Surgical History  Procedure Laterality Date  . Wisdom tooth extraction    . Endometrial ablation    . Suboccipital craniectomy cervical laminectomy  05/25/2012    Procedure: SUBOCCIPITAL CRANIECTOMY CERVICAL LAMINECTOMY/DURAPLASTY;  Surgeon: Carmela Hurt, MD;  Location: [facility];  Service: Neurosurgery;  Laterality: N/A;  Suboccipital Craniectomy for chiari malformation   No family history on file. History  Substance Use Topics  . Smoking status: Passive Smoke Exposure - Never Smoker --  0.25 packs/day for 5 years    Types: Cigarettes  . Smokeless tobacco: Not on file  . Alcohol Use: No   OB History   Grav Para Term Preterm Abortions TAB SAB Ect Mult Living                 Review of Systems  Constitutional: Negative for fever.  HENT: Positive for neck pain.   Eyes: Negative for photophobia.  Gastrointestinal: Negative for bowel incontinence.  Genitourinary: Negative for bladder incontinence.  Neurological: Negative for tingling, weakness, numbness and headaches.  All other systems reviewed and are negative.    Allergies  Review of patient's allergies indicates no known allergies.  Home Medications   Current Outpatient Rx  Name  Route  Sig  Dispense  Refill  . dexamethasone (DECADRON) 1 MG tablet      2 tablets twice daily for 2 days, one tablet twice daily for 2 days, one tablet daily for 2 days.   14 tablet   0   . naproxen sodium (ANAPROX) 220 MG tablet   Oral   Take 220-440 mg by mouth 2 (two) times daily as needed. Headache pain         . nitrofurantoin (MACRODANTIN) 50 MG capsule   Oral   Take 50 mg by mouth 4 (four) times daily. Completed 05/07/2012, for UTI         . OVER THE COUNTER MEDICATION   Oral   Take 1 tablet by mouth 2 (two) times daily as needed. Mucas relief  BP 112/85  Pulse 72  Temp(Src) 98.1 F (36.7 C) (Oral)  Resp 17  SpO2 99% Physical Exam  Nursing note and vitals reviewed. Constitutional: She is oriented to person, place, and time. She appears well-developed and well-nourished. She appears distressed.  HENT:  Head: Normocephalic and atraumatic.  Right Ear: Tympanic membrane and ear canal normal.  Left Ear: Tympanic membrane and ear canal normal.  Mouth/Throat: Oropharynx is clear and moist.  Eyes: Conjunctivae and EOM are normal. Pupils are equal, round, and reactive to light.  Neck: Neck supple. Normal carotid pulses present. Muscular tenderness present. No spinous process tenderness present. Carotid  bruit is not present.    Pain and palpable spasm over the right sternocleidomastoid   Cardiovascular: Normal rate, regular rhythm and intact distal pulses.   No murmur heard. Pulmonary/Chest: Effort normal and breath sounds normal. No stridor. No respiratory distress. She has no wheezes. She has no rales.  Abdominal: Soft. She exhibits no distension. There is no tenderness. There is no rebound and no guarding.  Musculoskeletal: Normal range of motion. She exhibits no edema and no tenderness.  2+ radial pulse bilaterally  Neurological: She is alert and oriented to person, place, and time.  Hand grips 5/5 bilaterally.  Normal sensation in the upper ext  Skin: Skin is warm and dry. No rash noted. No erythema.  Psychiatric: She has a normal mood and affect. Her behavior is normal.    ED Course  Procedures (including critical care time) Labs Review Labs Reviewed - No data to display Imaging Review Ct Cervical Spine Wo Contrast  08/17/2013   *RADIOLOGY REPORT*  Clinical Data: Neck pain  CT CERVICAL SPINE WITHOUT CONTRAST  Technique:  Multidetector CT imaging of the cervical spine was performed. Multiplanar CT image reconstructions were also generated.  Comparison: Prior MRI from 09/24/2011  Findings: Postoperative changes from prior suboccipital craniectomy for Chiari one malformation decompression are seen.  Evidence of complication.  Cerebellar tonsils remain low-lying.  There is reversal of the normal cervical lordosis, likely related to patient positioning.  The vertebral body heights are preserved. No prevertebral soft tissue swelling.  Normal C1-2 articulations are intact.  No significant degenerative disc disease is seen within the cervical spine. There is no acute fracture listhesis. There is incomplete fusion of the anterior ring of C1, likely chronic in nature.  IMPRESSION: 1.  No acute abnormality identified within the cervical spine. Reversal of the normal cervical lordosis likely related  the patient positioning. 2.  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 3.  Postoperative changes from prior decompressive suboccipital craniectomy for Chiari I malformation without complication.   Original Report Authenticated By: Rise Mu, M.D.    MDM   1. Sternocleidomastoid muscle tenderness     Patient presenting with symptoms most likely from muscle spasm in the right neck and torticollis. Significantly patient had a cure he malformation revision approximately one year ago and has had similar pain several months ago that resolved with several days of anti-inflammatories however when pain started 2 days ago he anti-inflammatories are now ineffective. She also states that she went to pick something up today and it made the pain much worse. She has severe pain with any movement of her neck or arm. She denies any infectious symptoms and denies any recent trauma or manipulation of the neck. No headache at this time. Patient has palpable spasm along the right sternocleidomastoid. Patient had CT for further evaluation and given pain control.  4:24  AM CT neg for acute findings.  Pt feeling much better after meds.  Will d/c home to f/u with PCP or Dr. Sueanne Margarita office  Gwyneth Sprout, MD 08/17/13 0425  Gwyneth Sprout, MD 08/17/13 [phone]

## 2013-08-17 NOTE — ED Notes (Signed)
Pt. reports right side neck muscle ache/stiffness onset last Tuesday got worse this evening after she tried to reach out for something  , pain worse with certain positions / palpation and movement.

## 2014-07-04 ENCOUNTER — Other Ambulatory Visit (HOSPITAL_COMMUNITY)
Admission: RE | Admit: 2014-07-04 | Discharge: 2014-07-04 | Disposition: A | Discharge status: Home / Self Care | Payer: 59 | Source: Ambulatory Visit | Attending: Family Medicine | Admitting: Family Medicine

## 2014-07-04 ENCOUNTER — Other Ambulatory Visit: Payer: Self-pay | Admitting: Family Medicine

## 2014-07-04 DIAGNOSIS — Z124 Encounter for screening for malignant neoplasm of cervix: Secondary | ICD-10-CM | POA: Insufficient documentation

## 2014-07-06 LAB — CYTOLOGY - PAP

## 2016-10-05 ENCOUNTER — Other Ambulatory Visit: Payer: Self-pay | Admitting: Family

## 2016-10-05 DIAGNOSIS — Z1231 Encounter for screening mammogram for malignant neoplasm of breast: Secondary | ICD-10-CM

## 2016-10-28 ENCOUNTER — Ambulatory Visit
Admission: RE | Admit: 2016-10-28 | Discharge: 2016-10-28 | Disposition: A | Discharge status: Home / Self Care | Payer: 59 | Source: Ambulatory Visit | Attending: Family | Admitting: Family

## 2016-10-28 DIAGNOSIS — Z1231 Encounter for screening mammogram for malignant neoplasm of breast: Secondary | ICD-10-CM

## 2016-10-30 ENCOUNTER — Other Ambulatory Visit: Payer: Self-pay | Admitting: Family

## 2016-10-30 DIAGNOSIS — R928 Other abnormal and inconclusive findings on diagnostic imaging of breast: Secondary | ICD-10-CM

## 2016-11-06 ENCOUNTER — Ambulatory Visit
Admission: RE | Admit: 2016-11-06 | Discharge: 2016-11-06 | Disposition: A | Discharge status: Home / Self Care | Payer: 59 | Source: Ambulatory Visit | Attending: Family | Admitting: Family

## 2016-11-06 DIAGNOSIS — R928 Other abnormal and inconclusive findings on diagnostic imaging of breast: Secondary | ICD-10-CM

## 2017-12-09 DIAGNOSIS — Z1231 Encounter for screening mammogram for malignant neoplasm of breast: Secondary | ICD-10-CM | POA: Diagnosis not present

## 2017-12-10 DIAGNOSIS — Z719 Counseling, unspecified: Secondary | ICD-10-CM | POA: Diagnosis not present

## 2017-12-21 DIAGNOSIS — Z719 Counseling, unspecified: Secondary | ICD-10-CM | POA: Diagnosis not present

## 2017-12-28 DIAGNOSIS — Z719 Counseling, unspecified: Secondary | ICD-10-CM | POA: Diagnosis not present

## 2018-01-04 DIAGNOSIS — Z719 Counseling, unspecified: Secondary | ICD-10-CM | POA: Diagnosis not present

## 2018-01-11 DIAGNOSIS — Z719 Counseling, unspecified: Secondary | ICD-10-CM | POA: Diagnosis not present

## 2018-08-03 DIAGNOSIS — L723 Sebaceous cyst: Secondary | ICD-10-CM | POA: Diagnosis not present

## 2018-08-03 DIAGNOSIS — R208 Other disturbances of skin sensation: Secondary | ICD-10-CM | POA: Diagnosis not present

## 2018-10-20 DIAGNOSIS — Z13228 Encounter for screening for other metabolic disorders: Secondary | ICD-10-CM | POA: Diagnosis not present

## 2018-10-28 DIAGNOSIS — Z0000 Encounter for general adult medical examination without abnormal findings: Secondary | ICD-10-CM | POA: Diagnosis not present

## 2019-02-23 ENCOUNTER — Other Ambulatory Visit: Payer: Self-pay | Admitting: *Deleted

## 2019-02-23 DIAGNOSIS — R928 Other abnormal and inconclusive findings on diagnostic imaging of breast: Secondary | ICD-10-CM

## 2019-03-20 ENCOUNTER — Other Ambulatory Visit: Payer: Self-pay

## 2019-03-20 ENCOUNTER — Ambulatory Visit
Admission: RE | Admit: 2019-03-20 | Discharge: 2019-03-20 | Disposition: A | Discharge status: Home / Self Care | Payer: 59 | Source: Ambulatory Visit | Attending: *Deleted | Admitting: *Deleted

## 2019-03-20 DIAGNOSIS — R928 Other abnormal and inconclusive findings on diagnostic imaging of breast: Secondary | ICD-10-CM

## 2019-12-29 ENCOUNTER — Other Ambulatory Visit: Payer: Self-pay | Admitting: *Deleted

## 2019-12-29 DIAGNOSIS — Z1231 Encounter for screening mammogram for malignant neoplasm of breast: Secondary | ICD-10-CM

## 2020-02-05 ENCOUNTER — Other Ambulatory Visit: Payer: Self-pay

## 2020-02-05 ENCOUNTER — Ambulatory Visit
Admission: RE | Admit: 2020-02-05 | Discharge: 2020-02-05 | Disposition: A | Discharge status: Home / Self Care | Payer: 59 | Source: Ambulatory Visit | Attending: *Deleted | Admitting: *Deleted

## 2020-02-05 DIAGNOSIS — Z1231 Encounter for screening mammogram for malignant neoplasm of breast: Secondary | ICD-10-CM

## 2020-03-25 IMAGING — US ULTRASOUND RIGHT BREAST LIMITED
1 series · 10 of 10 positions shown · non-contrast
Comparison: January 18, 2019

CLINICAL DATA: Callback for possible asymmetry in the right breast.

EXAM:
DIGITAL DIAGNOSTIC RIGHT MAMMOGRAM WITH CAD AND TOMO
ULTRASOUND RIGHT BREAST

[Series 1: ultrasound right breast limited · 0.08mm/px · 10 of 10 slices shown]
[im 1/10]
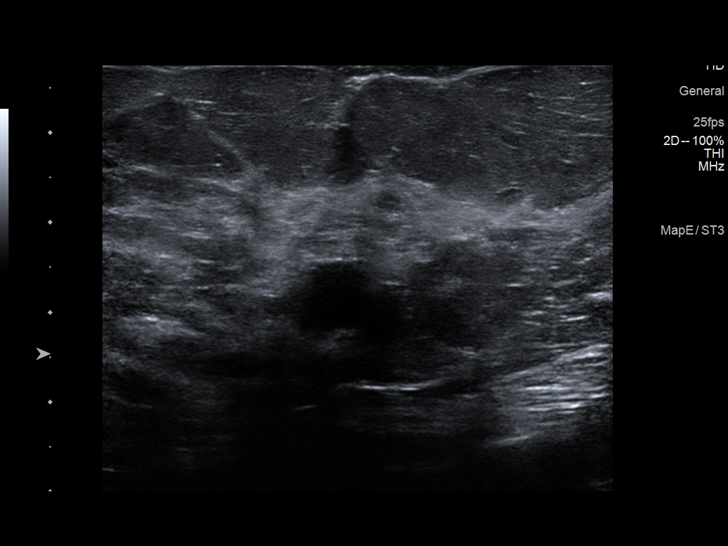
[im 2/10]
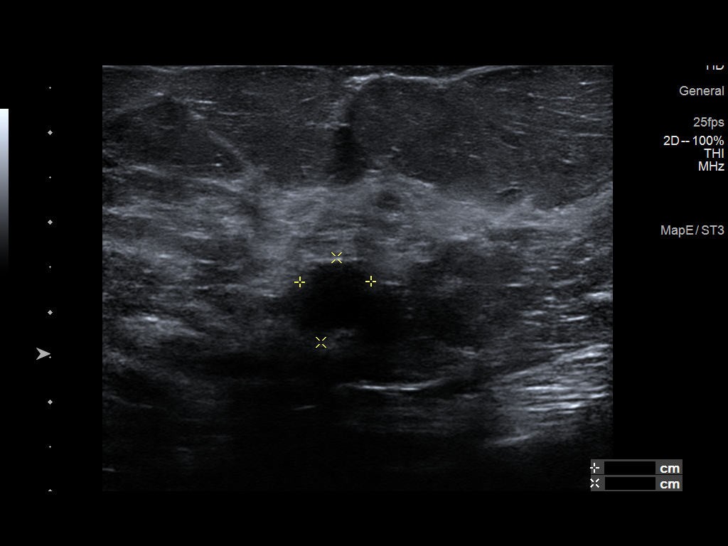
[im 3/10]
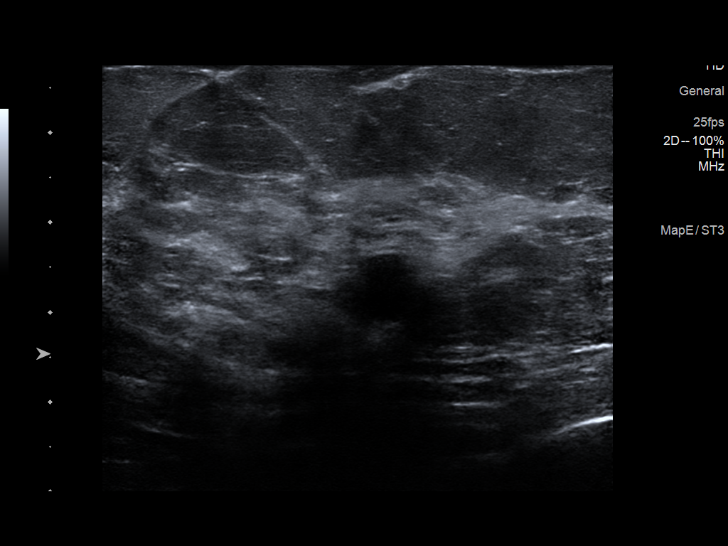
[im 4/10]
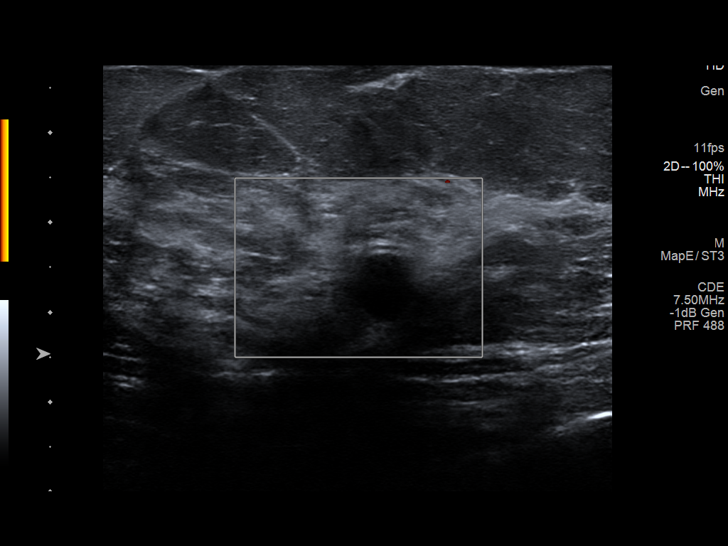
[im 5/10]
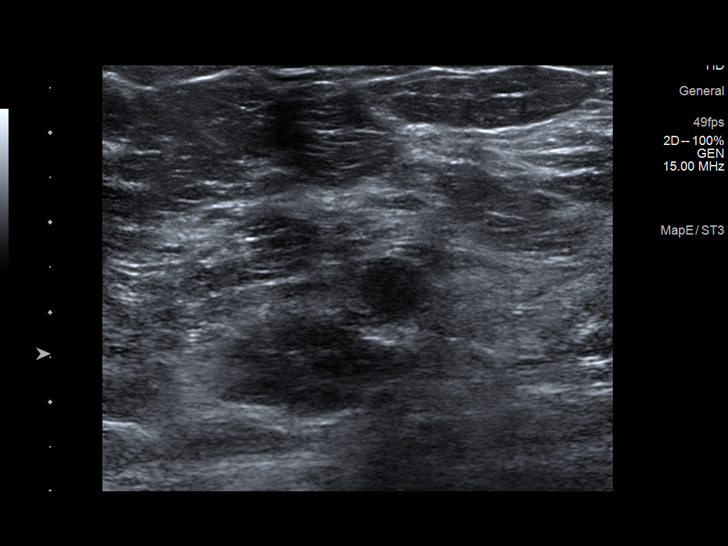
[im 6/10]
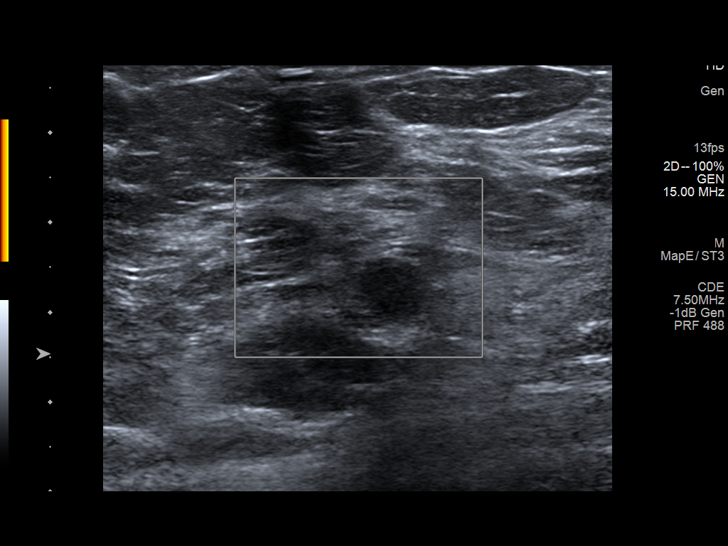
[im 7/10]
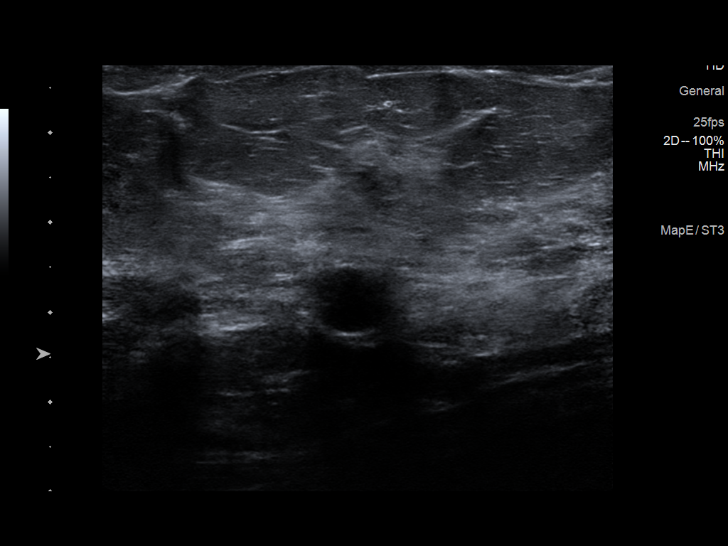
[im 8/10]
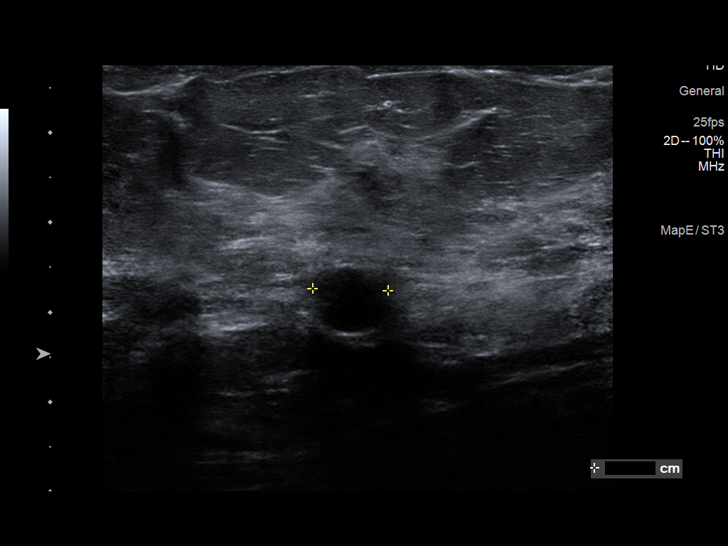
[im 9/10]
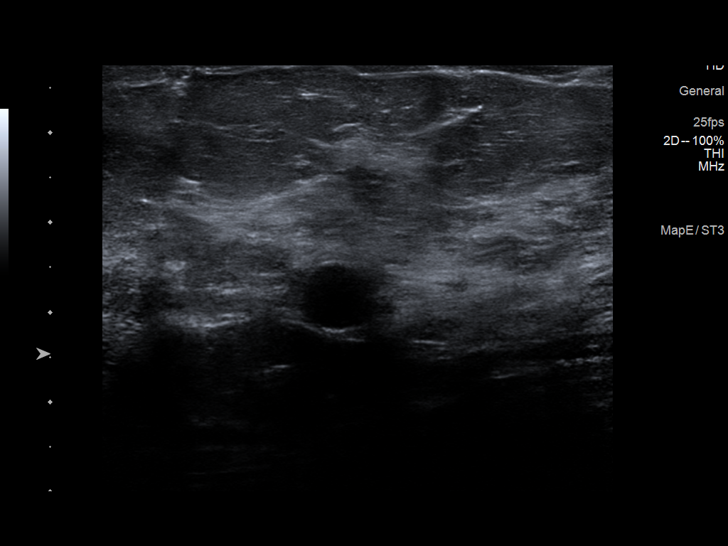
[im 10/10]
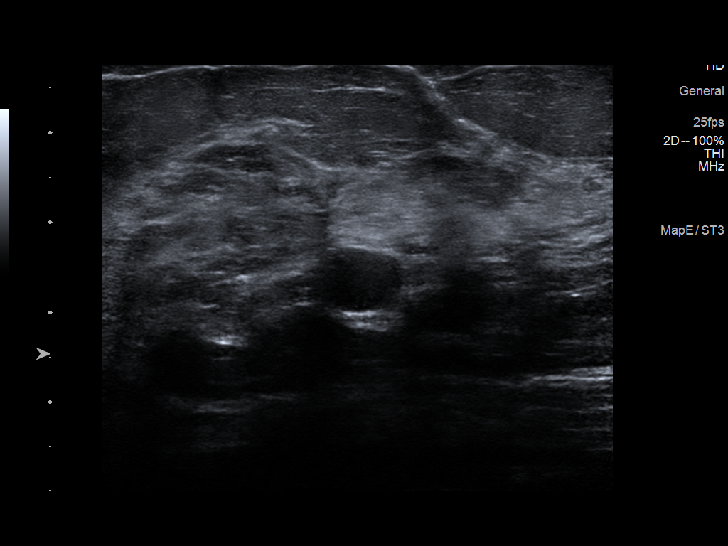

[10 of 10 positions shown; findings below may reference images not displayed]

ACR Breast Density Category c: The breast tissue is heterogeneously
dense, which may obscure small masses.
FINDINGS: Additional mammogram views confirm a low-density circumscribed oval
mass in the upper slightly inner right breast.

Mammographic images were processed with CAD.

Targeted ultrasound is performed, showing a circumscribed nearly
anechoic oval mass with well-defined posterior wall and some
posterior acoustic enhancement at [DATE] position 5 cm from the
nipple. This mass measures 0.8 x 0.8 x 1.0 cm and corresponds to the
mass seen on the mammogram. No internal vascular flow. Findings are
consistent with a benign cyst.
IMPRESSION: Benign cyst [DATE] position right breast.  No evidence of malignancy.

RECOMMENDATION:
Bilateral screening mammogram is recommended in January 2020.

I have discussed the findings and recommendations with the patient.
Results were also provided in writing at the conclusion of the
visit. If applicable, a reminder letter will be sent to the patient
regarding the next appointment.

BI-RADS CATEGORY  2: Benign.

## 2020-03-25 IMAGING — MG DIGITAL DIAGNOSTIC UNILATERAL RIGHT MAMMOGRAM WITH TOMO AND CAD
4 series · 4 of 12 positions shown · non-contrast
Comparison: January 18, 2019

CLINICAL DATA: Callback for possible asymmetry in the right breast.

EXAM:
DIGITAL DIAGNOSTIC RIGHT MAMMOGRAM WITH CAD AND TOMO
ULTRASOUND RIGHT BREAST

[R ML synth-2D]
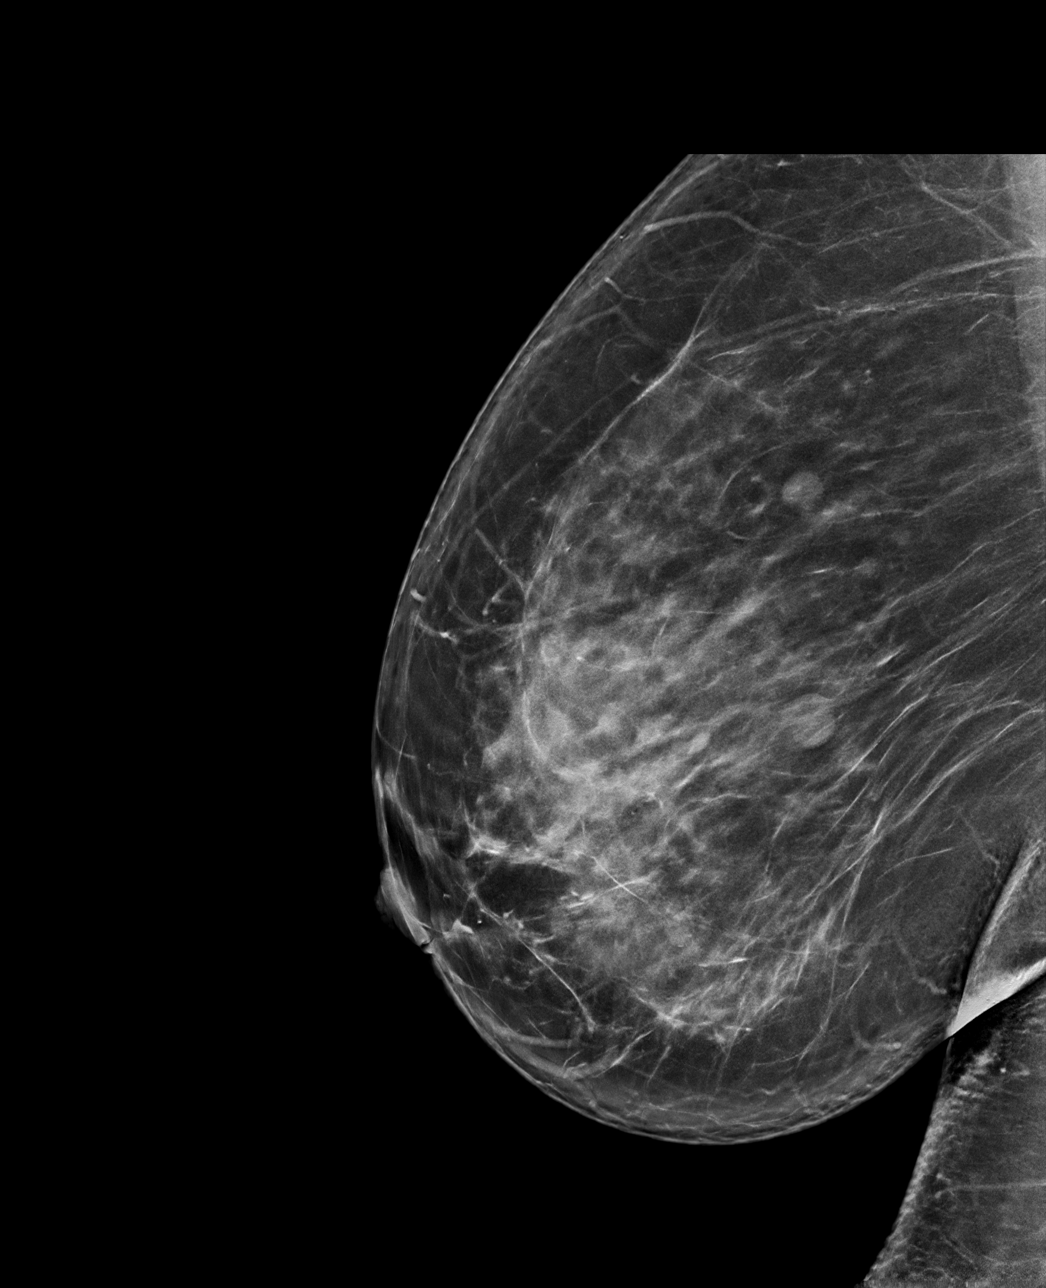

[R CC synth-2D]
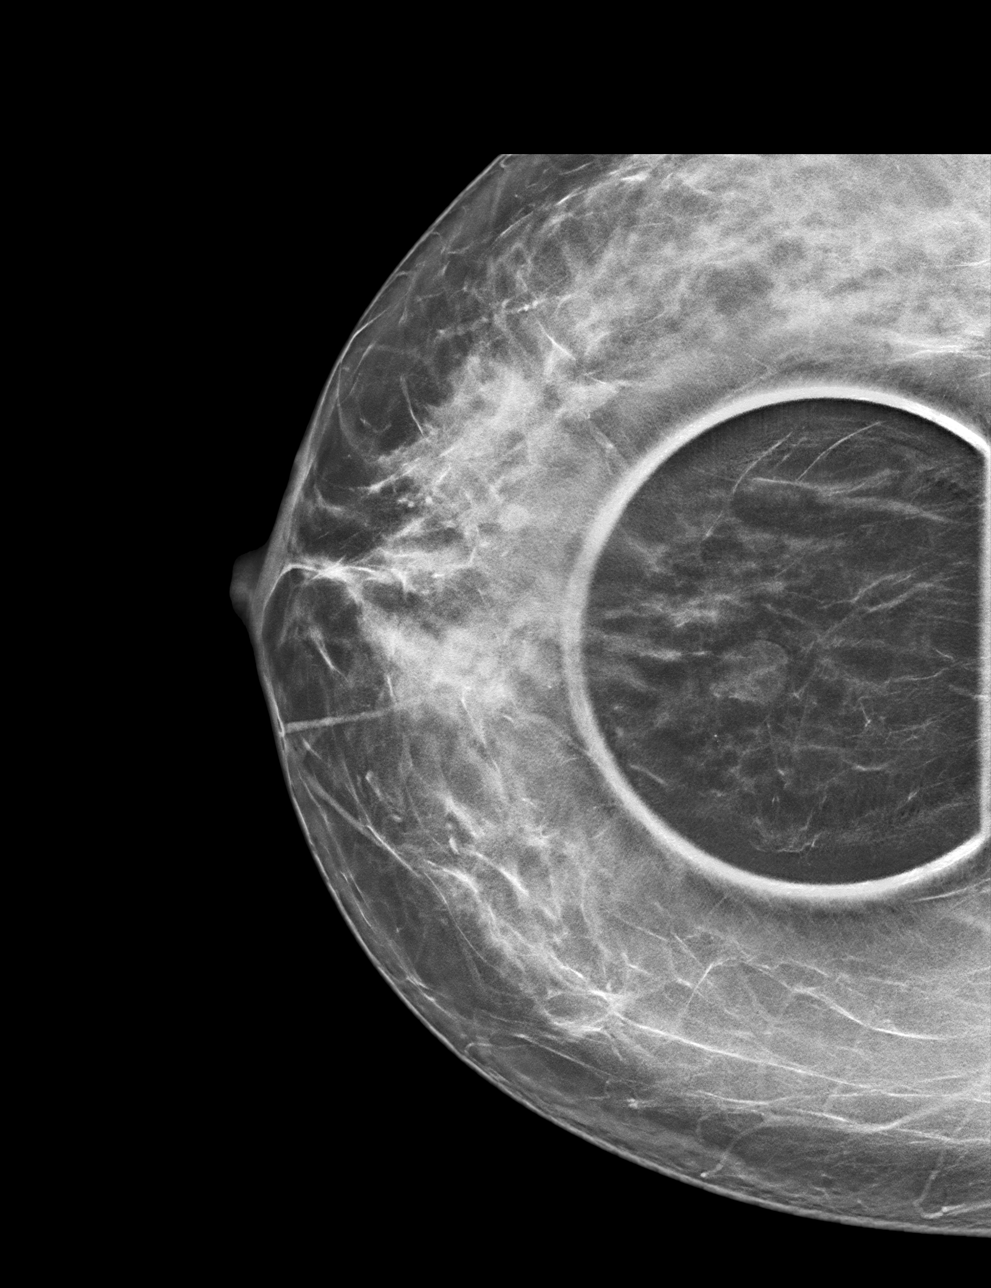

[R ML tomo · tomo slice 47/92.0]
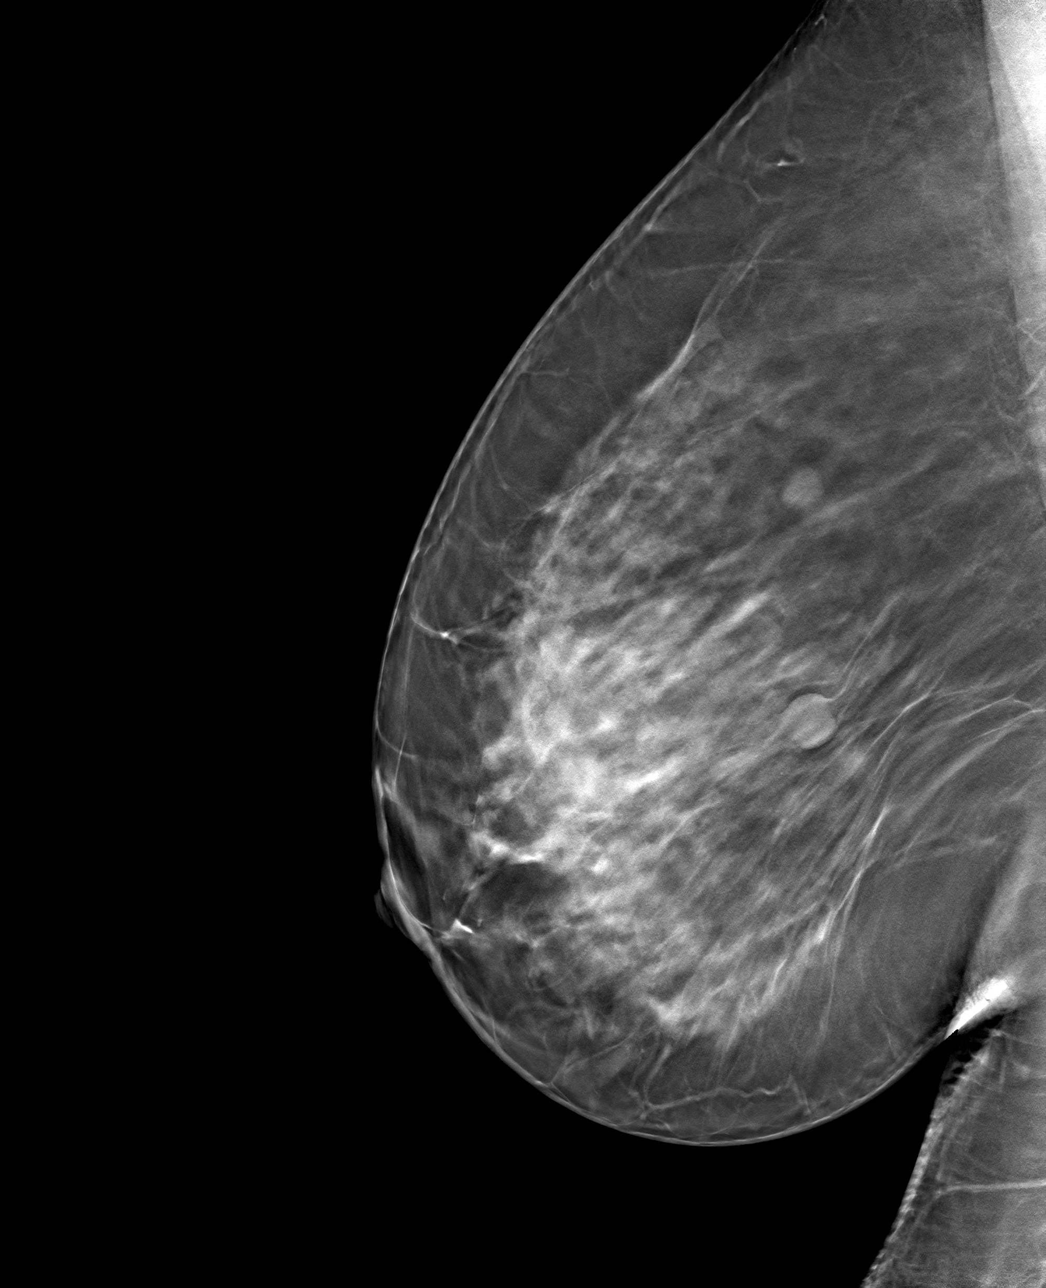

[R CC tomo · tomo slice 37/72.0]
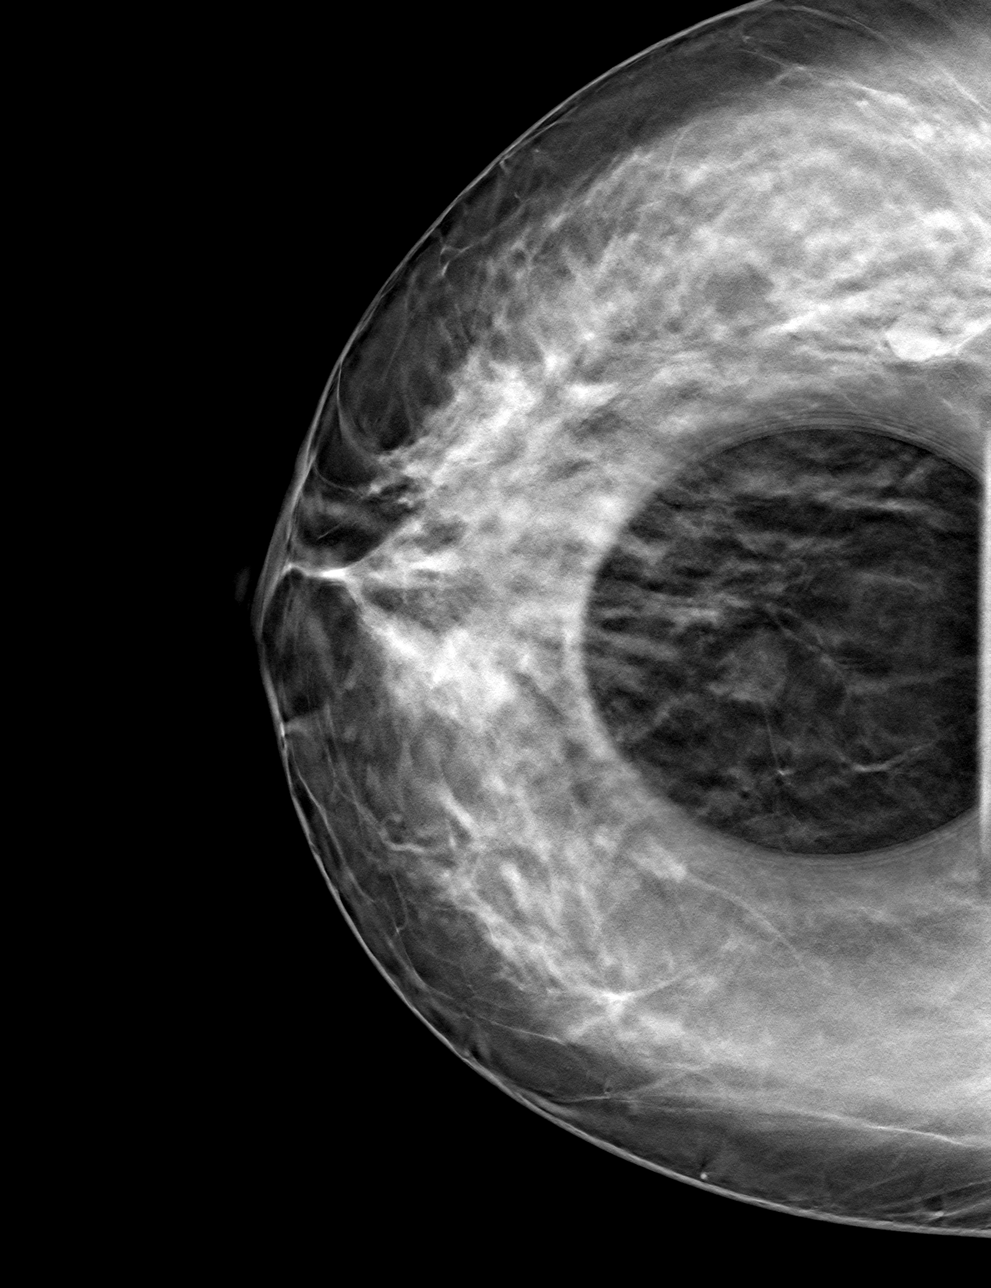

[4 of 12 positions shown; findings below may reference images not displayed]

ACR Breast Density Category c: The breast tissue is heterogeneously
dense, which may obscure small masses.
FINDINGS: Additional mammogram views confirm a low-density circumscribed oval
mass in the upper slightly inner right breast.

Mammographic images were processed with CAD.

Targeted ultrasound is performed, showing a circumscribed nearly
anechoic oval mass with well-defined posterior wall and some
posterior acoustic enhancement at [DATE] position 5 cm from the
nipple. This mass measures 0.8 x 0.8 x 1.0 cm and corresponds to the
mass seen on the mammogram. No internal vascular flow. Findings are
consistent with a benign cyst.
IMPRESSION: Benign cyst [DATE] position right breast.  No evidence of malignancy.

RECOMMENDATION:
Bilateral screening mammogram is recommended in January 2020.

I have discussed the findings and recommendations with the patient.
Results were also provided in writing at the conclusion of the
visit. If applicable, a reminder letter will be sent to the patient
regarding the next appointment.

BI-RADS CATEGORY  2: Benign.

## 2021-01-07 ENCOUNTER — Other Ambulatory Visit: Payer: Self-pay | Admitting: *Deleted

## 2021-01-07 DIAGNOSIS — Z1231 Encounter for screening mammogram for malignant neoplasm of breast: Secondary | ICD-10-CM

## 2021-02-11 DIAGNOSIS — Z1231 Encounter for screening mammogram for malignant neoplasm of breast: Secondary | ICD-10-CM

## 2021-03-28 ENCOUNTER — Ambulatory Visit: Payer: 59

## 2021-04-01 ENCOUNTER — Other Ambulatory Visit: Payer: Self-pay

## 2021-04-01 ENCOUNTER — Ambulatory Visit
Admission: RE | Admit: 2021-04-01 | Discharge: 2021-04-01 | Disposition: A | Discharge status: Home / Self Care | Payer: 59 | Source: Ambulatory Visit | Attending: *Deleted | Admitting: *Deleted

## 2021-04-01 ENCOUNTER — Other Ambulatory Visit: Payer: Self-pay | Admitting: Family Medicine

## 2021-04-01 DIAGNOSIS — Z1231 Encounter for screening mammogram for malignant neoplasm of breast: Secondary | ICD-10-CM

## 2022-02-17 ENCOUNTER — Other Ambulatory Visit: Payer: Self-pay | Admitting: Family Medicine

## 2022-02-17 DIAGNOSIS — Z1231 Encounter for screening mammogram for malignant neoplasm of breast: Secondary | ICD-10-CM

## 2022-04-02 DIAGNOSIS — Z1231 Encounter for screening mammogram for malignant neoplasm of breast: Secondary | ICD-10-CM

## 2022-04-10 ENCOUNTER — Ambulatory Visit
Admission: RE | Admit: 2022-04-10 | Discharge: 2022-04-10 | Disposition: A | Discharge status: Home / Self Care | Payer: 59 | Source: Ambulatory Visit | Attending: Family Medicine | Admitting: Family Medicine

## 2022-04-10 DIAGNOSIS — Z1231 Encounter for screening mammogram for malignant neoplasm of breast: Secondary | ICD-10-CM

## 2022-04-15 ENCOUNTER — Other Ambulatory Visit: Payer: Self-pay | Admitting: Family Medicine

## 2022-04-15 DIAGNOSIS — R928 Other abnormal and inconclusive findings on diagnostic imaging of breast: Secondary | ICD-10-CM

## 2022-04-21 ENCOUNTER — Ambulatory Visit
Admission: RE | Admit: 2022-04-21 | Discharge: 2022-04-21 | Disposition: A | Discharge status: Home / Self Care | Payer: 59 | Source: Ambulatory Visit | Attending: Family Medicine | Admitting: Family Medicine

## 2022-04-21 DIAGNOSIS — R928 Other abnormal and inconclusive findings on diagnostic imaging of breast: Secondary | ICD-10-CM

## 2023-02-26 ENCOUNTER — Other Ambulatory Visit: Payer: Self-pay | Admitting: Family Medicine

## 2023-02-26 DIAGNOSIS — Z1231 Encounter for screening mammogram for malignant neoplasm of breast: Secondary | ICD-10-CM

## 2023-04-27 ENCOUNTER — Ambulatory Visit: Payer: 59

## 2023-05-07 ENCOUNTER — Ambulatory Visit: Payer: 59

## 2023-05-24 ENCOUNTER — Ambulatory Visit
Admission: RE | Admit: 2023-05-24 | Discharge: 2023-05-24 | Disposition: A | Discharge status: Home / Self Care | Payer: 59 | Source: Ambulatory Visit | Attending: Family Medicine | Admitting: Family Medicine

## 2023-05-24 DIAGNOSIS — Z1231 Encounter for screening mammogram for malignant neoplasm of breast: Secondary | ICD-10-CM

## 2023-05-27 ENCOUNTER — Other Ambulatory Visit: Payer: Self-pay | Admitting: Family Medicine

## 2023-05-27 DIAGNOSIS — R928 Other abnormal and inconclusive findings on diagnostic imaging of breast: Secondary | ICD-10-CM

## 2023-06-02 ENCOUNTER — Other Ambulatory Visit: Payer: 59

## 2023-06-03 ENCOUNTER — Ambulatory Visit
Admission: RE | Admit: 2023-06-03 | Discharge: 2023-06-03 | Disposition: A | Discharge status: Home / Self Care | Payer: 59 | Source: Ambulatory Visit | Attending: Family Medicine | Admitting: Family Medicine

## 2023-06-03 DIAGNOSIS — R928 Other abnormal and inconclusive findings on diagnostic imaging of breast: Secondary | ICD-10-CM

## 2024-04-14 ENCOUNTER — Other Ambulatory Visit: Payer: Self-pay | Admitting: Family Medicine

## 2024-04-14 DIAGNOSIS — Z1231 Encounter for screening mammogram for malignant neoplasm of breast: Secondary | ICD-10-CM

## 2024-06-08 ENCOUNTER — Ambulatory Visit
Admission: RE | Admit: 2024-06-08 | Discharge: 2024-06-08 | Disposition: A | Discharge status: Home / Self Care | Source: Ambulatory Visit | Attending: Family Medicine | Admitting: Family Medicine

## 2024-06-08 DIAGNOSIS — Z1231 Encounter for screening mammogram for malignant neoplasm of breast: Secondary | ICD-10-CM
